# Patient Record
Sex: Female | Born: 1935 | Race: White | Hispanic: No | Smoking: Never smoker
Health system: Southern US, Community
[De-identification: ages and names within clinical notes are randomized; demographics above are authoritative.]

## PROBLEM LIST (undated history)

## (undated) DIAGNOSIS — G309 Alzheimer's disease, unspecified: Secondary | ICD-10-CM

## (undated) DIAGNOSIS — I10 Essential (primary) hypertension: Secondary | ICD-10-CM

## (undated) DIAGNOSIS — M810 Age-related osteoporosis without current pathological fracture: Secondary | ICD-10-CM

## (undated) DIAGNOSIS — F0280 Dementia in other diseases classified elsewhere without behavioral disturbance: Secondary | ICD-10-CM

## (undated) DIAGNOSIS — Z950 Presence of cardiac pacemaker: Secondary | ICD-10-CM

## (undated) HISTORY — PX: BREAST SURGERY: SHX581

---

## 2009-01-29 DIAGNOSIS — Z950 Presence of cardiac pacemaker: Secondary | ICD-10-CM

## 2009-01-29 HISTORY — DX: Presence of cardiac pacemaker: Z95.0

## 2009-01-29 HISTORY — PX: PACEMAKER INSERTION: SHX728

## 2013-05-11 DIAGNOSIS — G309 Alzheimer's disease, unspecified: Secondary | ICD-10-CM

## 2013-05-11 DIAGNOSIS — F0280 Dementia in other diseases classified elsewhere without behavioral disturbance: Secondary | ICD-10-CM | POA: Insufficient documentation

## 2013-05-11 DIAGNOSIS — I10 Essential (primary) hypertension: Secondary | ICD-10-CM | POA: Insufficient documentation

## 2013-05-11 DIAGNOSIS — M810 Age-related osteoporosis without current pathological fracture: Secondary | ICD-10-CM | POA: Insufficient documentation

## 2013-10-13 DIAGNOSIS — Z8673 Personal history of transient ischemic attack (TIA), and cerebral infarction without residual deficits: Secondary | ICD-10-CM | POA: Insufficient documentation

## 2013-10-13 DIAGNOSIS — I495 Sick sinus syndrome: Secondary | ICD-10-CM | POA: Insufficient documentation

## 2013-10-13 DIAGNOSIS — Z950 Presence of cardiac pacemaker: Secondary | ICD-10-CM | POA: Insufficient documentation

## 2013-10-14 ENCOUNTER — Emergency Department (HOSPITAL_COMMUNITY): Payer: Medicare Other

## 2013-10-14 ENCOUNTER — Inpatient Hospital Stay (HOSPITAL_COMMUNITY)
Admission: EM | Admit: 2013-10-14 | Discharge: 2013-10-16 | DRG: 310 | Disposition: A | Discharge status: Nursing Home (Includes ICF) | Payer: Medicare Other | Attending: Internal Medicine | Admitting: Internal Medicine

## 2013-10-14 ENCOUNTER — Encounter (HOSPITAL_COMMUNITY): Payer: Self-pay | Admitting: Emergency Medicine

## 2013-10-14 DIAGNOSIS — M810 Age-related osteoporosis without current pathological fracture: Secondary | ICD-10-CM | POA: Diagnosis present

## 2013-10-14 DIAGNOSIS — F0280 Dementia in other diseases classified elsewhere without behavioral disturbance: Secondary | ICD-10-CM | POA: Diagnosis present

## 2013-10-14 DIAGNOSIS — I10 Essential (primary) hypertension: Secondary | ICD-10-CM | POA: Diagnosis present

## 2013-10-14 DIAGNOSIS — R55 Syncope and collapse: Secondary | ICD-10-CM | POA: Diagnosis present

## 2013-10-14 DIAGNOSIS — I4892 Unspecified atrial flutter: Secondary | ICD-10-CM | POA: Diagnosis present

## 2013-10-14 DIAGNOSIS — I480 Paroxysmal atrial fibrillation: Secondary | ICD-10-CM

## 2013-10-14 DIAGNOSIS — Z79899 Other long term (current) drug therapy: Secondary | ICD-10-CM

## 2013-10-14 DIAGNOSIS — Z950 Presence of cardiac pacemaker: Secondary | ICD-10-CM | POA: Diagnosis present

## 2013-10-14 DIAGNOSIS — I479 Paroxysmal tachycardia, unspecified: Secondary | ICD-10-CM | POA: Diagnosis present

## 2013-10-14 DIAGNOSIS — Z7982 Long term (current) use of aspirin: Secondary | ICD-10-CM

## 2013-10-14 DIAGNOSIS — E860 Dehydration: Secondary | ICD-10-CM | POA: Diagnosis present

## 2013-10-14 DIAGNOSIS — G309 Alzheimer's disease, unspecified: Secondary | ICD-10-CM | POA: Diagnosis present

## 2013-10-14 DIAGNOSIS — F0390 Unspecified dementia without behavioral disturbance: Secondary | ICD-10-CM

## 2013-10-14 DIAGNOSIS — I4891 Unspecified atrial fibrillation: Secondary | ICD-10-CM | POA: Diagnosis not present

## 2013-10-14 DIAGNOSIS — Z66 Do not resuscitate: Secondary | ICD-10-CM | POA: Diagnosis present

## 2013-10-14 HISTORY — DX: Age-related osteoporosis without current pathological fracture: M81.0

## 2013-10-14 HISTORY — DX: Dementia in other diseases classified elsewhere, unspecified severity, without behavioral disturbance, psychotic disturbance, mood disturbance, and anxiety: F02.80

## 2013-10-14 HISTORY — DX: Alzheimer's disease, unspecified: G30.9

## 2013-10-14 HISTORY — DX: Essential (primary) hypertension: I10

## 2013-10-14 HISTORY — DX: Presence of cardiac pacemaker: Z95.0

## 2013-10-14 LAB — URINALYSIS, ROUTINE W REFLEX MICROSCOPIC
Bilirubin Urine: NEGATIVE
Glucose, UA: NEGATIVE mg/dL
Hgb urine dipstick: NEGATIVE
KETONES UR: NEGATIVE mg/dL
LEUKOCYTES UA: NEGATIVE
NITRITE: NEGATIVE
Protein, ur: NEGATIVE mg/dL
Specific Gravity, Urine: 1.008 (ref 1.005–1.030)
Urobilinogen, UA: 0.2 mg/dL (ref 0.0–1.0)
pH: 7 (ref 5.0–8.0)

## 2013-10-14 LAB — BASIC METABOLIC PANEL
ANION GAP: 8 (ref 5–15)
BUN: 19 mg/dL (ref 6–23)
CO2: 29 meq/L (ref 19–32)
Calcium: 10.6 mg/dL — ABNORMAL HIGH (ref 8.4–10.5)
Chloride: 101 mEq/L (ref 96–112)
Creatinine, Ser: 0.96 mg/dL (ref 0.50–1.10)
GFR calc Af Amer: 59 mL/min — ABNORMAL LOW (ref >90)
GFR calc non Af Amer: 51 mL/min — ABNORMAL LOW (ref >90)
Glucose, Bld: 69 mg/dL — ABNORMAL LOW (ref 70–99)
Potassium: 3.7 mEq/L (ref 3.7–5.3)
SODIUM: 138 meq/L (ref 137–147)

## 2013-10-14 LAB — CBC WITH DIFFERENTIAL/PLATELET
BASOS ABS: 0 10*3/uL (ref 0.0–0.1)
Basophils Relative: 1 % (ref 0–1)
Eosinophils Absolute: 0.1 10*3/uL (ref 0.0–0.7)
Eosinophils Relative: 2 % (ref 0–5)
HCT: 39.8 % (ref 36.0–46.0)
Hemoglobin: 12.9 g/dL (ref 12.0–15.0)
LYMPHS PCT: 22 % (ref 12–46)
Lymphs Abs: 1.1 10*3/uL (ref 0.7–4.0)
MCH: 31.2 pg (ref 26.0–34.0)
MCHC: 32.4 g/dL (ref 30.0–36.0)
MCV: 96.4 fL (ref 78.0–100.0)
Monocytes Absolute: 0.4 10*3/uL (ref 0.1–1.0)
Monocytes Relative: 8 % (ref 3–12)
NEUTROS ABS: 3.6 10*3/uL (ref 1.7–7.7)
NEUTROS PCT: 67 % (ref 43–77)
PLATELETS: 142 10*3/uL — AB (ref 150–400)
RBC: 4.13 MIL/uL (ref 3.87–5.11)
RDW: 13.4 % (ref 11.5–15.5)
WBC: 5.2 10*3/uL (ref 4.0–10.5)

## 2013-10-14 LAB — I-STAT TROPONIN, ED: Troponin i, poc: 0.01 ng/mL (ref 0.00–0.08)

## 2013-10-14 MED ORDER — DONEPEZIL HCL 5 MG PO TABS
5.0000 mg | ORAL_TABLET | Freq: Every day | ORAL | Status: DC
  Administered 2013-10-14 – 2013-10-15 (×2): 5 mg via ORAL
  Filled 2013-10-14 (×3): qty 1

## 2013-10-14 MED ORDER — ENOXAPARIN SODIUM 40 MG/0.4ML ~~LOC~~ SOLN
40.0000 mg | SUBCUTANEOUS | Status: DC
  Administered 2013-10-15: 40 mg via SUBCUTANEOUS
  Filled 2013-10-14 (×3): qty 0.4

## 2013-10-14 MED ORDER — RALOXIFENE HCL 60 MG PO TABS
60.0000 mg | ORAL_TABLET | Freq: Every day | ORAL | Status: DC
  Administered 2013-10-15 – 2013-10-16 (×2): 60 mg via ORAL
  Filled 2013-10-14 (×2): qty 1

## 2013-10-14 MED ORDER — POTASSIUM CHLORIDE CRYS ER 20 MEQ PO TBCR
20.0000 meq | EXTENDED_RELEASE_TABLET | Freq: Once | ORAL | Status: AC
  Administered 2013-10-14: 20 meq via ORAL
  Filled 2013-10-14: qty 1

## 2013-10-14 MED ORDER — ACETAMINOPHEN 325 MG PO TABS: 650.0000 mg | ORAL_TABLET | Freq: Four times a day (QID) | ORAL | Status: DC | PRN

## 2013-10-14 MED ORDER — ONDANSETRON HCL 4 MG PO TABS: 4.0000 mg | ORAL_TABLET | Freq: Four times a day (QID) | ORAL | Status: DC | PRN

## 2013-10-14 MED ORDER — SODIUM CHLORIDE 0.9 % IJ SOLN
3.0000 mL | Freq: Two times a day (BID) | INTRAMUSCULAR | Status: DC
  Administered 2013-10-14 – 2013-10-16 (×4): 3 mL via INTRAVENOUS

## 2013-10-14 MED ORDER — MEMANTINE HCL 10 MG PO TABS
10.0000 mg | ORAL_TABLET | Freq: Two times a day (BID) | ORAL | Status: DC
  Administered 2013-10-14 – 2013-10-16 (×4): 10 mg via ORAL
  Filled 2013-10-14 (×5): qty 1

## 2013-10-14 MED ORDER — ONDANSETRON HCL 4 MG/2ML IJ SOLN: 4.0000 mg | Freq: Four times a day (QID) | INTRAMUSCULAR | Status: DC | PRN

## 2013-10-14 MED ORDER — ACETAMINOPHEN 650 MG RE SUPP: 650.0000 mg | Freq: Four times a day (QID) | RECTAL | Status: DC | PRN

## 2013-10-14 MED ORDER — ASPIRIN 81 MG PO CHEW
81.0000 mg | CHEWABLE_TABLET | Freq: Every day | ORAL | Status: DC
  Administered 2013-10-15 – 2013-10-16 (×2): 81 mg via ORAL
  Filled 2013-10-14 (×2): qty 1

## 2013-10-14 MED ORDER — METOPROLOL TARTRATE 100 MG PO TABS
100.0000 mg | ORAL_TABLET | Freq: Two times a day (BID) | ORAL | Status: DC
  Administered 2013-10-14 – 2013-10-16 (×4): 100 mg via ORAL
  Filled 2013-10-14 (×5): qty 1

## 2013-10-14 MED ORDER — METOPROLOL TARTRATE 100 MG PO TABS
100.0000 mg | ORAL_TABLET | Freq: Two times a day (BID) | ORAL | Status: DC
  Filled 2013-10-14: qty 1

## 2013-10-14 MED ORDER — AMIODARONE HCL 200 MG PO TABS
200.0000 mg | ORAL_TABLET | Freq: Two times a day (BID) | ORAL | Status: DC
  Administered 2013-10-14 – 2013-10-16 (×4): 200 mg via ORAL
  Filled 2013-10-14 (×5): qty 1

## 2013-10-14 NOTE — ED Provider Notes (Signed)
CSN: [id number]     Arrival date & time 10/14/13  1052 History   First MD Initiated Contact with Patient 10/14/13 1100     Chief Complaint  Patient presents with  . Loss of Consciousness     (Consider location/radiation/quality/duration/timing/severity/associated sxs/prior Treatment) HPI  This is an 80 year old female with a history of Alzheimer's dementia, hypertension, and pacemaker who presents from [facility] assisted [facility] following a syncopal episode.  She was at breakfast this morning at approximately 8:45 AM, she was witnessed to have an unprovoked syncopal episode. She was unresponsive for a short period of time.  Her baseline upon arrival. Patient continually asked "why am I here." She denies any physical complaints including chest pain, shortness breath, abdominal pain, headache. Patient's daughters at the bedside. Daughter states that she is more confused than normal. Currently oriented only x2.  Followed by [facility] Dr. Mayme Genta and was just seen yesterday.  Level V caveat for dementia  Past Medical History  Diagnosis Date  . Pacemaker   . Alzheimer's dementia   . Hypertension   . Osteoporosis    Past Surgical History  Procedure Laterality Date  . Breast surgery     No family history on file. History  Substance Use Topics  . Smoking status: Never Smoker   . Smokeless tobacco: Not on file  . Alcohol Use: No   OB History   Grav Para Term Preterm Abortions TAB SAB Ect Mult Living                 Review of Systems  Constitutional: Negative for fever.  Respiratory: Negative for chest tightness and shortness of breath.   Cardiovascular: Negative for chest pain.  Gastrointestinal: Negative for nausea, vomiting and abdominal pain.  Genitourinary: Negative for dysuria.  Neurological: Positive for syncope. Negative for dizziness, weakness and headaches.  Psychiatric/Behavioral: Positive for confusion.      Allergies  Review of patient's  allergies indicates no known allergies.  Home Medications   Prior to Admission medications   Not on File   BP 118/50  Pulse 64  Temp(Src) 97.6 F (36.4 C) (Oral)  Resp 20  Ht  (1.753 m)  Wt 148 lb (67.132 kg)  BMI 21.85 kg/m2  SpO2 98% Physical Exam  Nursing note and vitals reviewed. Constitutional: She is oriented to person, place, and time. She appears well-developed and well-nourished.  Elderly, well groomed  HENT:  Head: Normocephalic and atraumatic.  Mouth/Throat: Oropharynx is clear and moist.  Eyes: Pupils are equal, round, and reactive to light.  Cardiovascular: Normal rate, regular rhythm and normal heart sounds.   No murmur heard. Pulmonary/Chest: Effort normal and breath sounds normal. No respiratory distress. She has no wheezes.  Pacer palpated   Abdominal: Soft. Bowel sounds are normal. There is no tenderness. There is no rebound.  Musculoskeletal: She exhibits no edema.  Neurological: She is alert and oriented to person, place, and time.  Cranial nerves II through XII intact, 5 out of 5 strength in all 4 extremities, fluent speech  Skin: Skin is warm and dry.  Psychiatric: She has a normal mood and affect.    ED Course  Procedures (including critical care time) Labs Review Labs Reviewed  CBC WITH DIFFERENTIAL - Abnormal; Notable for the following:    Platelets 142 (*)    All other components within normal limits  BASIC METABOLIC PANEL - Abnormal; Notable for the following:    Glucose, Bld 69 (*)    Calcium 10.6 (*)  GFR calc non Af Amer 51 (*)    GFR calc Af Amer 59 (*)    All other components within normal limits  URINALYSIS, ROUTINE W REFLEX MICROSCOPIC  I-STAT TROPOININ, ED    Imaging Review Ct Head Wo Contrast  10/14/2013   CLINICAL DATA:  Loss of consciousness.  EXAM: CT HEAD WITHOUT CONTRAST  TECHNIQUE: Contiguous axial images were obtained from the base of the skull through the vertex without intravenous contrast.  COMPARISON:  None.   FINDINGS: No acute intracranial hemorrhage. No focal mass lesion. No CT evidence of acute infarction. No midline shift or mass effect. No hydrocephalus. Basilar cisterns are patent.  Measure as cortical atrophy. There is mild periventricular white matter hypodensities.  IMPRESSION: 1. No acute intracranial findings. 2. Cortical atrophy and periventricular white matter microvascular disease   Electronically Signed   By: Genevive Bi M.D.   On: 10/14/2013 12:42   Dg Chest Portable 1 View  10/14/2013   CLINICAL DATA:  Hypertension and altered mental status  EXAM: PORTABLE CHEST - 1 VIEW  COMPARISON:  None.  FINDINGS: There is no edema or consolidation. Heart is slightly enlarged with pulmonary vascularity within normal limits. Pacemaker leads are attached to the right atrium and right ventricle. No pneumothorax. No adenopathy.  IMPRESSION: No edema or consolidation. Heart mildly enlarged. Pacemaker present.   Electronically Signed   By: Bretta Bang M.D.   On: 10/14/2013 11:25     EKG Interpretation   Date/Time:  Wednesday October 14 2013 11:01:35 EDT Ventricular Rate:  63 PR Interval:  260 QRS Duration: 93 QT Interval:  409 QTC Calculation: 419 R Axis:   38 Text Interpretation:  Sinus rhythm Atrial premature complex Prolonged PR  interval Low voltage, precordial leads Abnormal R-wave progression, early  transition ST elevation suggests acute pericarditis No prior for  comparison Confirmed by Ellsworth Waldschmidt  MD, Toni Amend ([id number]) on 10/14/2013 12:05:25  PM      MDM   Final diagnoses:  Syncope, cardiogenic    Patient presents with an episode of syncope. History of dementia and is oriented x2. Has no physical complaints at this time. She is nonfocal on exam.  EKG shows abnormal R-wave progression early ST elevation which is diffuse. No prior for comparison.  Troponin and chest x-ray are reassuring. Basic labwork is also reassuring including urine. Pacemaker interrogation by [employer] revealed several episodes between 8:30 and 9:15 this morning of what appears to be atrial tachycardia with rates greater than 200.  This timeframe corresponds with time noted by living facility the patient had the syncopal episode. Patient's primary cardiologist is [facility] cardiology. Discussed with nurse at the cardiology office. The patient's cardiologist and PA are not in the office today. We'll admit to the hospitalist with cardiology evaluation here.    Shon Baton, MD 10/15/13 [phone]

## 2013-10-14 NOTE — ED Notes (Signed)
Admitting MD at bedside.

## 2013-10-14 NOTE — ED Notes (Signed)
[employer] at bedside.  

## 2013-10-14 NOTE — Consult Note (Signed)
Reason for Consult:   Syncope  Requesting Physician: [facility]  HPI: This is a 80 y.o.,pleasant female with a past medical history significant for dementia. 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. She was recently placed in assisted living in [facility], she was previously living alone in [place]. Her daughters felt that she was unable to care for herself. The pt's history is obtained from her daughter who was present through the interview and exam. The pt herself is awake and alert and [pleasant but unable to give any details of today's events or her PMH.  The pt apparently was at breakfast at her assisted living facility when she suddenly collapsed into her plate. There was no seizure activity noted. She was brought to the ER by EMS. Pacer interrogation revealed "V-tachy" rhythm 165-200. When I interviewed her she was completely asymptomatic. Her daughter tells me she was just recently seen by [facility] to get established and for some recent low extremity edema. I have requested those records.   PMHx:  Past Medical History  Diagnosis Date  . Pacemaker   . Alzheimer's dementia   . Hypertension   . Osteoporosis     Past Surgical History  Procedure Laterality Date  . Breast surgery      SOCHx:  reports that she has never smoked. She does not have any smokeless tobacco history on file. She reports that she does not drink alcohol or use illicit drugs.  FAMHx: No family history of coronary artery disease   ALLERGIES: No Known Allergies  ROS: Review of systems not obtained due to patient factors.  HOME MEDICATIONS: Prior to Admission medications   Medication Sig Start Date End Date Taking? Authorizing Provider  aspirin 81 MG chewable tablet Chew 81 mg by mouth daily.   Yes Historical Provider, MD  donepezil (ARICEPT) 5 MG tablet Take 5 mg by mouth at bedtime.   Yes Historical Provider,  MD  furosemide (LASIX) 40 MG tablet Take 40 mg by mouth daily.   Yes Historical Provider, MD  memantine (NAMENDA) 10 MG tablet Take 10 mg by mouth 2 (two) times daily.   Yes Historical Provider, MD  metoprolol (LOPRESSOR) 100 MG tablet Take 100 mg by mouth 2 (two) times daily.   Yes Historical Provider, MD  raloxifene (EVISTA) 60 MG tablet Take 60 mg by mouth daily.   Yes Historical Provider, MD    HOSPITAL MEDICATIONS: I have reviewed the patient's current medications.  VITALS: Blood pressure 115/50, pulse 122, temperature 97.6 F (36.4 C), temperature source Oral, resp. rate 22, height  (1.753 m), weight 146 lb 11.2 oz (66.543 kg), SpO2 92.00%.  PHYSICAL EXAM: General appearance: alert, cooperative, no distress and thin, pleasantly demented Neck: no carotid bruit and no JVD Lungs: clear to auscultation bilaterally Heart: regular rate and rhythm and soft systolic murmur LSB Abdomen: soft, non-tender; bowel sounds normal; no masses,  no organomegaly Extremities: trace to 1+ LE edema Pulses: diminnished Skin: pale, cool, dry Neurologic: Grossly normal except for memory deficit   LABS: Results for orders placed during the hospital encounter of 10/14/13 (from the past 24 hour(s))  CBC WITH DIFFERENTIAL     Status: Abnormal   Collection Time    10/14/13 11:11 AM      Result Value Ref Range   WBC 5.2  4.0 - 10.5 K/uL   RBC 4.13  3.87 - 5.11 MIL/uL   Hemoglobin 12.9  12.0 - 15.0 g/dL   HCT 16.1  09.6 - 04.5 %   MCV 96.4  78.0 - 100.0 fL   MCH 31.2  26.0 - 34.0 pg   MCHC 32.4  30.0 - 36.0 g/dL   RDW 40.9  81.1 - 91.4 %   Platelets 142 (*) 150 - 400 K/uL   Neutrophils Relative % 67  43 - 77 %   Neutro Abs 3.6  1.7 - 7.7 K/uL   Lymphocytes Relative 22  12 - 46 %   Lymphs Abs 1.1  0.7 - 4.0 K/uL   Monocytes Relative 8  3 - 12 %   Monocytes Absolute 0.4  0.1 - 1.0 K/uL   Eosinophils Relative 2  0 - 5 %   Eosinophils Absolute 0.1  0.0 - 0.7 K/uL   Basophils Relative 1  0 - 1  %   Basophils Absolute 0.0  0.0 - 0.1 K/uL  BASIC METABOLIC PANEL     Status: Abnormal   Collection Time    10/14/13 11:11 AM      Result Value Ref Range   Sodium 138  137 - 147 mEq/L   Potassium 3.7  3.7 - 5.3 mEq/L   Chloride 101  96 - 112 mEq/L   CO2 29  19 - 32 mEq/L   Glucose, Bld 69 (*) 70 - 99 mg/dL   BUN 19  6 - 23 mg/dL   Creatinine, Ser 7.82  0.50 - 1.10 mg/dL   Calcium 95.6 (*) 8.4 - 10.5 mg/dL   GFR calc non Af Amer 51 (*) >90 mL/min   GFR calc Af Amer 59 (*) >90 mL/min   Anion gap 8  5 - 15  URINALYSIS, ROUTINE W REFLEX MICROSCOPIC     Status: None   Collection Time    10/14/13 11:30 AM      Result Value Ref Range   Color, Urine YELLOW  YELLOW   APPearance CLEAR  CLEAR   Specific Gravity, Urine 1.008  1.005 - 1.030   pH 7.0  5.0 - 8.0   Glucose, UA NEGATIVE  NEGATIVE mg/dL   Hgb urine dipstick NEGATIVE  NEGATIVE   Bilirubin Urine NEGATIVE  NEGATIVE   Ketones, ur NEGATIVE  NEGATIVE mg/dL   Protein, ur NEGATIVE  NEGATIVE mg/dL   Urobilinogen, UA 0.2  0.0 - 1.0 mg/dL   Nitrite NEGATIVE  NEGATIVE   Leukocytes, UA NEGATIVE  NEGATIVE  I-STAT TROPOININ, ED     Status: None   Collection Time    10/14/13  1:45 PM      Result Value Ref Range   Troponin i, poc 0.01  0.00 - 0.08 ng/mL   Comment 3             EKG: NSR 1st AVB, diffuse ST elevation, QTc 419  IMAGING: Ct Head Wo Contrast  10/14/2013   CLINICAL DATA:  Loss of consciousness.  EXAM: CT HEAD WITHOUT CONTRAST  TECHNIQUE: Contiguous axial images were obtained from the base of the skull through the vertex without intravenous contrast.  COMPARISON:  None.  FINDINGS: No acute intracranial hemorrhage. No focal mass lesion. No CT evidence of acute infarction. No midline shift or mass effect. No hydrocephalus. Basilar cisterns are patent.  Measure as cortical atrophy. There is mild periventricular white matter hypodensities.  IMPRESSION: 1. No acute intracranial findings. 2. Cortical atrophy and periventricular white  matter microvascular disease   Electronically Signed  By: Genevive Bi M.D.   On: 10/14/2013 12:42   Dg Chest Portable 1 View  10/14/2013   CLINICAL DATA:  Hypertension and altered mental status  EXAM: PORTABLE CHEST - 1 VIEW  COMPARISON:  None.  FINDINGS: There is no edema or consolidation. Heart is slightly enlarged with pulmonary vascularity within normal limits. Pacemaker leads are attached to the right atrium and right ventricle. No pneumothorax. No adenopathy.  IMPRESSION: No edema or consolidation. Heart mildly enlarged. Pacemaker present.   Electronically Signed   By: Bretta Bang M.D.   On: 10/14/2013 11:25    IMPRESSION: Principal Problem:   Syncope and collapse Active Problems:   Tachycardia, paroxysmal- HR as high as 200 (Vent tachy rythm) per pacemaker check   Cardiac pacemaker in situ-implanted 2011   HTN (hypertension)   Dementia- recently placed in assited living   RECOMMENDATION: 20 meqKCL x 1. Continue beta blocker (metoprolol 100 mg BID), review records from [facility]. Dr Ladona Ridgel to see and review pacemaker strips. Check TSH, Mg++ in am.   Time Spent Directly with Patient: 45 minutes  Abelino Derrick [phone] beeper 10/14/2013, 3:55 PM   EP Attending  Patient seen and examined.Her PPM data has been reivewedAgree with above except that the patient's arrhythmia is actually a combination of atrial tachy with 1:1 AV conduction as well as atrial flutter with an RVR. She does not have VT. This is despite fairly high dose beta blockade. I would recommend initiation of amiodarone. She will need another day or two in the hospital.    Leonia Reeves.D.

## 2013-10-14 NOTE — ED Notes (Signed)
Per EMS, patient was at dinner table this morning, at [facility] and the staff found her "passed out and unresponsive".   They called 911 and patient was awake, alert and at baseline when EMS arrived.   Patient does have history of alzheimers, pacemaker, etc.   Patients CBG 102 with EMS.   22 L hand placed.

## 2013-10-14 NOTE — H&P (Signed)
[facility] History and Physical  Leonna Lacross ZDG:[id number] DOB: 1922/04/04 DOA: 10/14/2013  Referring physician:  PCP: Herb Grays, MD  Specialists:   Chief Complaint: syncope   HPI: Kelli Pittman is a 80 y.o. female with PMH of HTN, s/p PPM, dementia presented from [place] assisted living following a syncopal episode happened during the breakfast time this morning; She was witnessed to have an unprovoked syncopal episode. She was unresponsive for a short period of time; Patient has dementia, poor historian; but no focal neuro weaknesses, no urinary or fecal incontinence; no fever, no SOB, no chest pain, no nausea, vomiting or diarrhea -Pacemaker interrogation: prelim atrial tachyarrythmia during syncope; cardiology consulted by ED   Review of Systems: The patient denies anorexia, fever, weight loss,, vision loss, decreased hearing, hoarseness, chest pain, dyspnea on exertion, peripheral edema, balance deficits, hemoptysis, abdominal pain, melena, hematochezia, severe indigestion/heartburn, hematuria, incontinence, genital sores, muscle weakness, suspicious skin lesions, transient blindness, difficulty walking, depression, unusual weight change, abnormal bleeding, enlarged lymph nodes, angioedema, and breast masses.    Past Medical History  Diagnosis Date  . Pacemaker   . Alzheimer's dementia   . Hypertension   . Osteoporosis    Past Surgical History  Procedure Laterality Date  . Breast surgery     Social History:  reports that she has never smoked. She does not have any smokeless tobacco history on file. She reports that she does not drink alcohol or use illicit drugs. AS;  where does patient live--home, ALF, SNF? and with whom if at home? Yes;  Can patient participate in ADLs?  No Known Allergies  No family history on file.  (be sure to complete)  Prior to Admission medications   Medication Sig Start Date End Date Taking? Authorizing Provider  aspirin 81 MG chewable  tablet Chew 81 mg by mouth daily.   Yes Historical Provider, MD  donepezil (ARICEPT) 5 MG tablet Take 5 mg by mouth at bedtime.   Yes Historical Provider, MD  furosemide (LASIX) 40 MG tablet Take 40 mg by mouth daily.   Yes Historical Provider, MD  memantine (NAMENDA) 10 MG tablet Take 10 mg by mouth 2 (two) times daily.   Yes Historical Provider, MD  metoprolol (LOPRESSOR) 100 MG tablet Take 100 mg by mouth 2 (two) times daily.   Yes Historical Provider, MD  raloxifene (EVISTA) 60 MG tablet Take 60 mg by mouth daily.   Yes Historical Provider, MD   Physical Exam: Filed Vitals:   10/14/13 1513  BP:   Pulse: 122  Temp:   Resp:      General:  Alert, confused  Eyes: EOM-I  ENT: no oral ulcers   Neck: supple, no JVD  Cardiovascular: s1,s2 rrr  Respiratory: CTA BL  Abdomen: soft, nt,nd   Skin: no rash   Musculoskeletal: no LE edema  Psychiatric: no hallucinations   Neurologic: confused at baseline; CN 2-12 intact; motor 5/5 BL  Labs on Admission:  Basic Metabolic Panel:  Recent Labs Lab 10/14/13 1111  NA 138  K 3.7  CL 101  CO2 29  GLUCOSE 69*  BUN 19  CREATININE 0.96  CALCIUM 10.6*   Liver Function Tests: No results found for this basename: AST, ALT, ALKPHOS, BILITOT, PROT, ALBUMIN,  in the last 168 hours No results found for this basename: LIPASE, AMYLASE,  in the last 168 hours No results found for this basename: AMMONIA,  in the last 168 hours CBC:  Recent Labs Lab 10/14/13 1111  WBC 5.2  NEUTROABS 3.6  HGB 12.9  HCT 39.8  MCV 96.4  PLT 142*   Cardiac Enzymes: No results found for this basename: CKTOTAL, CKMB, CKMBINDEX, TROPONINI,  in the last 168 hours  BNP (last 3 results) No results found for this basename: PROBNP,  in the last 8760 hours CBG: No results found for this basename: GLUCAP,  in the last 168 hours  Radiological Exams on Admission: Ct Head Wo Contrast  10/14/2013   CLINICAL DATA:  Loss of consciousness.  EXAM: CT HEAD  WITHOUT CONTRAST  TECHNIQUE: Contiguous axial images were obtained from the base of the skull through the vertex without intravenous contrast.  COMPARISON:  None.  FINDINGS: No acute intracranial hemorrhage. No focal mass lesion. No CT evidence of acute infarction. No midline shift or mass effect. No hydrocephalus. Basilar cisterns are patent.  Measure as cortical atrophy. There is mild periventricular white matter hypodensities.  IMPRESSION: 1. No acute intracranial findings. 2. Cortical atrophy and periventricular white matter microvascular disease   Electronically Signed   By: Genevive Bi M.D.   On: 10/14/2013 12:42   Dg Chest Portable 1 View  10/14/2013   CLINICAL DATA:  Hypertension and altered mental status  EXAM: PORTABLE CHEST - 1 VIEW  COMPARISON:  None.  FINDINGS: There is no edema or consolidation. Heart is slightly enlarged with pulmonary vascularity within normal limits. Pacemaker leads are attached to the right atrium and right ventricle. No pneumothorax. No adenopathy.  IMPRESSION: No edema or consolidation. Heart mildly enlarged. Pacemaker present.   Electronically Signed   By: Bretta Bang M.D.   On: 10/14/2013 11:25    EKG: Independently reviewed.   Assessment/Plan Principal Problem:   Syncope   80 y.o. female with PMH of HTN, s/p PPM, dementia presented from [place] assisted living following a syncopal episode   1. Syncope suspected cardiac arrythmia; PPM interrogation: tachyarrhythmia; neuro exam no focal;  -monitor on tele; cont BB; cardiology eval; hold diuretic, obtain echo  -no s/s of infection, CXR, UA unremarkable; monitor   2. Dementia confused; cont home regimen -CT: No acute intracranial findings. Cortical atrophy and periventricular white matter microvascular disease 3. HTN, cont BB; hold diuretics; Pt is clinically euvolemic;  4. S/p PPM (2011); f/u cardiology eval   D/w patient, her daughter   Cardiology;  if consultant consulted, please document  name and whether formally or informally consulted  Code Status: DNR (must indicate code status--if unknown or must be presumed, indicate so) Family Communication: d/w patient, her daughter  (indicate person spoken with, if applicable, with phone number if by telephone) Disposition Plan: pend cardiology eval  (indicate anticipated LOS)  Time spent: >35 minutes   Esperanza Sheets [facility] Pager [phone]  If 7PM-7AM, please contact night-coverage [email or web address] Password [facility] 10/14/2013, 3:45 PM

## 2013-10-14 NOTE — ED Notes (Signed)
I gave the patient a happy meal and a cup of decaf coffee.

## 2013-10-15 DIAGNOSIS — I359 Nonrheumatic aortic valve disorder, unspecified: Secondary | ICD-10-CM

## 2013-10-15 DIAGNOSIS — F0280 Dementia in other diseases classified elsewhere without behavioral disturbance: Secondary | ICD-10-CM | POA: Diagnosis present

## 2013-10-15 DIAGNOSIS — R55 Syncope and collapse: Secondary | ICD-10-CM | POA: Diagnosis present

## 2013-10-15 DIAGNOSIS — Z950 Presence of cardiac pacemaker: Secondary | ICD-10-CM | POA: Diagnosis not present

## 2013-10-15 DIAGNOSIS — Z79899 Other long term (current) drug therapy: Secondary | ICD-10-CM | POA: Diagnosis not present

## 2013-10-15 DIAGNOSIS — I10 Essential (primary) hypertension: Secondary | ICD-10-CM | POA: Diagnosis present

## 2013-10-15 DIAGNOSIS — I4892 Unspecified atrial flutter: Secondary | ICD-10-CM | POA: Diagnosis present

## 2013-10-15 DIAGNOSIS — I4891 Unspecified atrial fibrillation: Secondary | ICD-10-CM | POA: Diagnosis present

## 2013-10-15 DIAGNOSIS — M810 Age-related osteoporosis without current pathological fracture: Secondary | ICD-10-CM | POA: Diagnosis present

## 2013-10-15 DIAGNOSIS — Z7982 Long term (current) use of aspirin: Secondary | ICD-10-CM | POA: Diagnosis not present

## 2013-10-15 DIAGNOSIS — E860 Dehydration: Secondary | ICD-10-CM | POA: Diagnosis present

## 2013-10-15 DIAGNOSIS — Z66 Do not resuscitate: Secondary | ICD-10-CM | POA: Diagnosis present

## 2013-10-15 LAB — MAGNESIUM: Magnesium: 2 mg/dL (ref 1.5–2.5)

## 2013-10-15 LAB — TSH: TSH: 3.35 u[IU]/mL (ref 0.350–4.500)

## 2013-10-15 LAB — BASIC METABOLIC PANEL
Anion gap: 8 (ref 5–15)
BUN: 21 mg/dL (ref 6–23)
CO2: 30 mEq/L (ref 19–32)
Calcium: 10.3 mg/dL (ref 8.4–10.5)
Chloride: 106 mEq/L (ref 96–112)
Creatinine, Ser: 0.98 mg/dL (ref 0.50–1.10)
GFR calc Af Amer: 58 mL/min — ABNORMAL LOW (ref >90)
GFR calc non Af Amer: 50 mL/min — ABNORMAL LOW (ref >90)
Glucose, Bld: 95 mg/dL (ref 70–99)
Potassium: 4.5 mEq/L (ref 3.7–5.3)
Sodium: 144 mEq/L (ref 137–147)

## 2013-10-15 NOTE — Progress Notes (Signed)
SUBJECTIVE: The patient is alert to herself only this morning.  No family at bedside.  At this time, she denies chest pain, shortness of breath, or any new concerns.  She does not know why she is here or what happened prior to admission.    PPM interrogation yesterday demonstrated normal device function with runs of atrial tach vs atrial flutter with intermittent 1:1 conduction felt to be the likely cause of her syncope.   Amiodarone was added.   CURRENT MEDICATIONS: . amiodarone  200 mg Oral BID  . aspirin  81 mg Oral Daily  . donepezil  5 mg Oral QHS  . enoxaparin (LOVENOX) injection  40 mg Subcutaneous Q24H  . memantine  10 mg Oral BID  . metoprolol  100 mg Oral BID  . raloxifene  60 mg Oral Daily  . sodium chloride  3 mL Intravenous Q12H      OBJECTIVE: Physical Exam: Filed Vitals:   10/14/13 1542 10/14/13 1600 10/14/13 2043 10/15/13 0445  BP:  144/52 125/49 132/59  Pulse:  73 72 68  Temp:   98.1 F (36.7 C) 98.2 F (36.8 C)  TempSrc:   Oral Oral  Resp:  Height:      Weight: 146 lb 11.2 oz (66.543 kg)   153 lb 6.4 oz (69.582 kg)  SpO2:  100% 96% 95%    Intake/Output Summary (Last 24 hours) at 10/15/13 0817 Last data filed at 10/14/13 2142  Gross per 24 hour  Intake      3 ml  Output      0 ml  Net      3 ml    Telemetry reveals sinus rhythm with intermittent atrial pacing   GEN- The patient is elderly appearing, alert and oriented x 3 today.   Head- normocephalic, atraumatic Eyes-  Sclera clear, conjunctiva pink Ears- hearing intact Oropharynx- clear Neck- supple, no JVP Lungs- Clear to ausculation bilaterally, normal work of breathing Heart- Regular rate and rhythm  GI- soft, NT, ND, + BS Extremities- no clubbing, cyanosis, or edema  LABS: Basic Metabolic Panel:  Recent Labs  65/78/46 1111 10/15/13 0545  NA 138 144  K 3.7 4.5  CL 101 106  CO2 29 30  GLUCOSE 69* 95  BUN 19 21  CREATININE 0.96 0.98  CALCIUM 10.6* 10.3  MG  --   2.0   CBC:  Recent Labs  10/14/13 1111  WBC 5.2  NEUTROABS 3.6  HGB 12.9  HCT 39.8  MCV 96.4  PLT 142*   Thyroid Function Tests:  Recent Labs  10/15/13 0545  TSH 3.350    RADIOLOGY: Ct Head Wo Contrast 10/14/2013   CLINICAL DATA:  Loss of consciousness.  EXAM: CT HEAD WITHOUT CONTRAST  TECHNIQUE: Contiguous axial images were obtained from the base of the skull through the vertex without intravenous contrast.  COMPARISON:  None.  FINDINGS: No acute intracranial hemorrhage. No focal mass lesion. No CT evidence of acute infarction. No midline shift or mass effect. No hydrocephalus. Basilar cisterns are patent.  Measure as cortical atrophy. There is mild periventricular white matter hypodensities.  IMPRESSION: 1. No acute intracranial findings. 2. Cortical atrophy and periventricular white matter microvascular disease   Electronically Signed   By: Genevive Bi M.D.   On: 10/14/2013 12:42   Dg Chest Portable 1 View 10/14/2013   CLINICAL DATA:  Hypertension and altered mental status  EXAM: PORTABLE CHEST - 1 VIEW  COMPARISON:  None.  FINDINGS:  There is no edema or consolidation. Heart is slightly enlarged with pulmonary vascularity within normal limits. Pacemaker leads are attached to the right atrium and right ventricle. No pneumothorax. No adenopathy.  IMPRESSION: No edema or consolidation. Heart mildly enlarged. Pacemaker present.   Electronically Signed   By: Bretta Bang M.D.   On: 10/14/2013 11:25    ASSESSMENT AND PLAN:  Principal Problem:   Syncope and collapse Active Problems:   Tachycardia, paroxysmal- HR as high as 200 (Vent tachy rythm) per pacemaker check   Cardiac pacemaker in situ-implanted 2011   HTN (hypertension)   Dementia- recently placed in assited living   Syncope  1. Atrial fibrillation with RVR Doing well with amiodarone Would continue amiodarone  BId x 7 days then amiodarone  daily thereafter chads2vasc score is at least4,  She should  follow-up with her primary cardiologist in [facility] in 1 week to discuss anticoagulation Check LFTs in am  Electrophysiology team to see as needed while here. Please call with questions.

## 2013-10-15 NOTE — Progress Notes (Signed)
Kelli Pittman PROGRESS NOTE  Kelli Pittman AOZ:[id number] DOB: 1974/06/22 DOA: 10/14/2013 PCP: Kelli Grays, MD  Assessment/Plan:   Syncope and Collapse -Likely secondary to arrhythmia- denies any episodes of syncope, dizziness or palpitaitons -CT head and CXR- No abnormalities -TSH, Mg, UA normal  Tachycardia -Stable HR -Appreciate Cardiology Following- arrhythmia is combination of atrial tachycardia and atrial flutter with RVR and does not have VT. Initiated Amiodarone 200 mg BID  -Continue BB metoprolol 100 mg BID -2D echo pending  Dementia -Recently placed in [facility] assisted living facility. -continue regimen of memantine and donepezil  Hypertension -Stable- continue metoprolol 100 mg BID  Hypercalcemia -Resolved.  -Follow BMET  Osteoprosis Continue regimen of raloxifene   DVT Prophylaxis Lovenox Code Status: DNR Family Communication: No family at bedside Disposition Plan: Inpatient, [facility] when stable   Consultants:  Cardiology  Procedures:    None  Antibiotics:  None  HPI This is a 80 y.o.Caucasian female with a PMH of dementia who presented to ED via EMS with  She was accompanied by her daughter yesterday who helped with history.  The patient is awake and alert but is unable to give details of what happened to her. The patient apparently was eating breakfast at her assisted living facility when she suddenly collapsed into her plate and subsequently regained consciousness.  There was no seizure activity noted. She was brought to the ER by EMS and pacer interrogation revealed "V-tachy" rhythm 165-200. When initially interviewed patient was asymptomatic. The daughter states patient has recently seen [facility] for lower extremity edema, and those records have been requested by cardiologist.  Subjective: Without complaints. Denies dizziness, SOB, chest pain   Objective: Filed Vitals:   10/15/13 1355  BP: 135/63   Pulse: 66  Temp: 98.2 F (36.8 C)  Resp: 16    Intake/Output Summary (Last 24 hours) at 10/15/13 1435 Last data filed at 10/15/13 1059  Gross per 24 hour  Intake    246 ml  Output      0 ml  Net    246 ml   Filed Weights   10/14/13 1056 10/14/13 1542 10/15/13 0445  Weight: 67.132 kg (148 lb) 66.543 kg (146 lb 11.2 oz) 69.582 kg (153 lb 6.4 oz)    Exam:  Gen: Alert only to self. Pleasantly demented caucasian female, in NAD  HEENT: Normocephalic, atraumatic.  Pupils symmertrical.  Moist mucosa.   Chest: clear to auscultate bilaterally, no ronchi or rales  Cardiac: Regular rate and rhythm, S1-S2, no rubs murmurs or gallops  Abdomen: soft, non tender, non distended, +bowel sounds. No guarding or rigidity  Extremities: Symmetrical in appearance without cyanosis. Trace edema  Neurological: Alert awake, oriented only to person.  Psychiatric: Appears normal.   Data Reviewed: Basic Metabolic Panel:  Recent Labs Lab 10/14/13 1111 10/15/13 0545  NA 138 144  K 3.7 4.5  CL 101 106  CO2 29 30  GLUCOSE 69* 95  BUN 19 21  CREATININE 0.96 0.98  CALCIUM 10.6* 10.3  MG  --  2.0   Liver Function Tests: No results found for this basename: AST, ALT, ALKPHOS, BILITOT, PROT, ALBUMIN,  in the last 168 hours No results found for this basename: LIPASE, AMYLASE,  in the last 168 hours No results found for this basename: AMMONIA,  in the last 168 hours CBC:  Recent Labs Lab 10/14/13 1111  WBC 5.2  NEUTROABS 3.6  HGB 12.9  HCT 39.8  MCV 96.4  PLT 142*  Cardiac Enzymes: No results found for this basename: CKTOTAL, CKMB, CKMBINDEX, TROPONINI,  in the last 168 hours BNP (last 3 results) No results found for this basename: PROBNP,  in the last 8760 hours CBG: No results found for this basename: GLUCAP,  in the last 168 hours  No results found for this or any previous visit (from the past 240 hour(s)).   Studies: Ct Head Wo Contrast  10/14/2013   CLINICAL DATA:  Loss of  consciousness.  EXAM: CT HEAD WITHOUT CONTRAST  TECHNIQUE: Contiguous axial images were obtained from the base of the skull through the vertex without intravenous contrast.  COMPARISON:  None.  FINDINGS: No acute intracranial hemorrhage. No focal mass lesion. No CT evidence of acute infarction. No midline shift or mass effect. No hydrocephalus. Basilar cisterns are patent.  Measure as cortical atrophy. There is mild periventricular white matter hypodensities.  IMPRESSION: 1. No acute intracranial findings. 2. Cortical atrophy and periventricular white matter microvascular disease   Electronically Signed   By: Kelli Pittman M.D.   On: 10/14/2013 12:42   Dg Chest Portable 1 View  10/14/2013   CLINICAL DATA:  Hypertension and altered mental status  EXAM: PORTABLE CHEST - 1 VIEW  COMPARISON:  None.  FINDINGS: There is no edema or consolidation. Heart is slightly enlarged with pulmonary vascularity within normal limits. Pacemaker leads are attached to the right atrium and right ventricle. No pneumothorax. No adenopathy.  IMPRESSION: No edema or consolidation. Heart mildly enlarged. Pacemaker present.   Electronically Signed   By: Kelli Pittman M.D.   On: 10/14/2013 11:25    Scheduled Meds: . amiodarone  200 mg Oral BID  . aspirin  81 mg Oral Daily  . donepezil  5 mg Oral QHS  . enoxaparin (LOVENOX) injection  40 mg Subcutaneous Q24H  . memantine  10 mg Oral BID  . metoprolol  100 mg Oral BID  . raloxifene  60 mg Oral Daily  . sodium chloride  3 mL Intravenous Q12H   Continuous Infusions:   Principal Problem:   Syncope and collapse Active Problems:   Tachycardia, paroxysmal- HR as high as 200 (Vent tachy rythm) per pacemaker check   Cardiac pacemaker in situ-implanted 2011   HTN (hypertension)   Dementia- recently placed in assited living   Syncope    Time spent: 35    Kelli Pittman Kelli Pittman  Kelli Pittman Pager [phone]. If 7PM-7AM, please contact night-coverage at [email or web address],  password [facility] 10/15/2013, 2:35 PM  LOS: 1 day

## 2013-10-15 NOTE — Progress Notes (Signed)
Utilization Review Completed.Sahar Ryback T9/17/2015  

## 2013-10-15 NOTE — Progress Notes (Signed)
-   I agree with plan and data as above. - Syncope most liekly due to Atrial flutter with 2:1 block. She responded well to amio. - Cont amio PO BID for 7 days then follow up with cards as an outpatient. - IF stable home in 24 hrs.

## 2013-10-15 NOTE — Progress Notes (Addendum)
Clinical Social Work Department BRIEF PSYCHOSOCIAL ASSESSMENT 10/15/2013  Patient:  Kelli Pittman, Kelli Pittman     Account Number:  [id number]     Admit date:  10/14/2013  Clinical Social Worker:  Harless Nakayama  Date/Time:  10/15/2013 10:45 AM  Referred by:  Physician  Date Referred:  10/15/2013 Referred for  ALF Placement   Other Referral:   Interview type:  Family Other interview type:   Spoke with pt daughter    PSYCHOSOCIAL DATA Living Status:  FACILITY Admitted from facility:  [facility] [address] Level of care:  Assisted Living Primary support name:  Newt Lukes Primary support relationship to patient:  CHILD, ADULT Degree of support available:   Pt has supportive family    CURRENT CONCERNS Current Concerns  Post-Acute Placement   Other Concerns:    SOCIAL WORK ASSESSMENT / PLAN CSW made aware that pt was admitted from facility. CSW spoke with pt daughter who confirmed pt was admitted from [facility] and has been a resident there for about 6 weeks. Pt daughter informed CSW she is happy with facility and has no concerns about care pt receives. Pt daughter did state they are anxious about pt dementia progressing quickly. At this time, pt is not on a memory care unit but pt daughter informed CSW this is something they know they will be transitioning to in the future. CSW discussed discharge with pt daughter. She had concerns about being able to get medication changes to facility and CSW explained that this was CSW role and CSW would assist with communicating with facility. Pt daughter thankful for this and expressed relief that CSW would be handleing this. Pt daughter informed CSW that she plans on transporting pt back to facility.    CSW called [facility] and spoke with Lauren who provided CSW with fax number [phone] and informed CSW that contact person to confirm dc would be April Moore or [facility]t Minion. CSW to send updated FL2 when dc medication list is  available.   Assessment/plan status:  Psychosocial Support/Ongoing Assessment of Needs Other assessment/ plan:   Information/referral to community resources:   None needed at this time    PATIENT'S/FAMILY'S RESPONSE TO PLAN OF CARE: Pt daughter pleasant and coopeartive. She is agreeable to pt returning to ALF.       Sammie Schermerhorn, LCSWA [phone]

## 2013-10-15 NOTE — Progress Notes (Signed)
Echocardiogram 2D Echocardiogram has been performed.  Rayen Dafoe 10/15/2013, 11:53 AM

## 2013-10-16 DIAGNOSIS — I10 Essential (primary) hypertension: Secondary | ICD-10-CM

## 2013-10-16 LAB — HEPATIC FUNCTION PANEL
ALT: 6 U/L (ref 0–35)
AST: 13 U/L (ref 0–37)
Albumin: 3.3 g/dL — ABNORMAL LOW (ref 3.5–5.2)
Alkaline Phosphatase: 69 U/L (ref 39–117)
BILIRUBIN TOTAL: 0.5 mg/dL (ref 0.3–1.2)
Bilirubin, Direct: <0.2 mg/dL (ref 0.0–0.3)
Total Protein: 5.5 g/dL — ABNORMAL LOW (ref 6.0–8.3)

## 2013-10-16 MED ORDER — AMIODARONE HCL 200 MG PO TABS: 200.0000 mg | ORAL_TABLET | Freq: Two times a day (BID) | ORAL | Status: DC

## 2013-10-16 MED ORDER — AMIODARONE HCL 200 MG PO TABS: 200.0000 mg | ORAL_TABLET | Freq: Every day | ORAL | Status: AC

## 2013-10-16 NOTE — Plan of Care (Signed)
Problem: Phase III Progression Outcomes Goal: Discharge plan remains appropriate-arrangements made Outcome: Progressing No acute events this shift.  Patient remains disoriented to place and situation at baseline. VSS.  No ectopy observed.  Will continue to monitor patient condition.

## 2013-10-16 NOTE — Progress Notes (Signed)
CSW (Clinical Child psychotherapist) aware that pt daughter chose to transport pt. Pt was discharged without dc packet. CSW spoke with Newt Minion at [facility] and notified. CSW faxed signed FL2 to facility. Lupita Leash confirmed receipt of fax and that everything look okay. CSW signing off.  Cybill Uriegas, LCSWA [phone]

## 2013-10-16 NOTE — Discharge Summary (Signed)
Physician Discharge Summary  Patient ID: Kelli Pittman MRN: [id number] DOB/AGE: February 05, 1922 80 y.o.  Admit date: 10/14/2013 Discharge date: 10/16/2013  Primary Care Physician:  Herb Grays, MD  Discharge Diagnoses:    . Syncope and collapse . Tachycardia, paroxysmal- HR as high as 200 (Vent tachy rythm) per pacemaker check . Cardiac pacemaker in situ-implanted 2011 . HTN (hypertension) . Dementia- recently placed in assited living . Syncope  Consults: Cardiology, Dr Ladona Ridgel   Recommendations for Outpatient Follow-up:  Patient will need assistance with activities of daily living, hygiene, feeding at the assisted living facility  Patient was recommended amiodarone  BID x 1 week, then continue 200 mg daily thereafter, please check TFTs, LFTs in 4-6 weeks.  Allergies:  No Known Allergies   Discharge Medications:   Medication List         amiodarone 200 MG tablet  Commonly known as:  PACERONE  Take 1 tablet (200 mg total) by mouth 2 (two) times daily. X 1 week     amiodarone 200 MG tablet  Commonly known as:  PACERONE  Take 1 tablet (200 mg total) by mouth daily.  Start taking on:  10/23/2013     aspirin 81 MG chewable tablet  Chew 81 mg by mouth daily.     donepezil 5 MG tablet  Commonly known as:  ARICEPT  Take 5 mg by mouth at bedtime.     furosemide 40 MG tablet  Commonly known as:  LASIX  Take 40 mg by mouth daily.     memantine 10 MG tablet  Commonly known as:  NAMENDA  Take 10 mg by mouth 2 (two) times daily.     metoprolol 100 MG tablet  Commonly known as:  LOPRESSOR  Take 100 mg by mouth 2 (two) times daily.     raloxifene 60 MG tablet  Commonly known as:  EVISTA  Take 60 mg by mouth daily.         Brief H and P: For complete details please refer to admission H and P, but in brief Kelli Pittman is a 80 y.o. female with PMH of HTN, s/p PPM, dementia presented from [facility] assisted living following a syncopal episode happened during the  breakfast time this morning; She was witnessed to have an unprovoked syncopal episode. She was unresponsive for a short period of time; Patient has dementia, poor historian; but no focal neuro weaknesses, no urinary or fecal incontinence; no fever, no SOB, no chest pain, no nausea, vomiting or diarrhea  -Pacemaker interrogation: prelim atrial tachyarrythmia during syncope  Hospital Course:   Syncope and Collapse -Likely secondary to arrhythmia, otherwise patient denied any episodes of syncope, dizziness or palpitaitons  -CT head and CXR- No abnormalities  -TSH, Mg, UA normal   Tachycardia : Now stable, cardiology was consulted, pacemaker was interrogated demonstrated normal device function withruns of atrial tach vs atrial flutter with intermittent 1:1 conduction felt to be the likely cause of her syncope. Amiodarone was added. Patient was recommended amiodarone  BID x 1 week, then continue 200 mg daily thereafter, please check TFTs, LFTs in 4-6 weeks.   Dementia  -Recently placed in [facility] assisted living facility,continue regimen of memantine and donepezil.   Hypertension -Stable- continue metoprolol 100 mg BID   Hypercalcemia likely due to dehydration improved  Osteoprosis  Continue regimen of raloxifene      Day of Discharge BP 132/50  Pulse 64  Temp(Src) 97.7 F (36.5 C) (Oral)  Resp 16  Ht 5'  9" (1.753 m)  Wt 65.363 kg (144 lb 1.6 oz)  BMI 21.27 kg/m2  SpO2 97%  Physical Exam: General: Alert and awake oriented to self and person, not in any acute distress. HEENT: anicteric sclera, pupils reactive to light and accommodation CVS: S1-S2 clear no murmur rubs or gallops Chest: clear to auscultation bilaterally, no wheezing rales or rhonchi Abdomen: soft nontender, nondistended, normal bowel sounds Extremities: no cyanosis, clubbing or edema noted bilaterally Neuro: Cranial nerves II-XII intact, no focal neurological deficits   The results of  significant diagnostics from this hospitalization (including imaging, microbiology, ancillary and laboratory) are listed below for reference.    LAB RESULTS: Basic Metabolic Panel:  Recent Labs Lab 10/14/13 1111 10/15/13 0545  NA 138 144  K 3.7 4.5  CL 101 106  CO2 29 30  GLUCOSE 69* 95  BUN 19 21  CREATININE 0.96 0.98  CALCIUM 10.6* 10.3  MG  --  2.0   Liver Function Tests:  Recent Labs Lab 10/16/13 0436  AST 13  ALT 6  ALKPHOS 69  BILITOT 0.5  PROT 5.5*  ALBUMIN 3.3*   No results found for this basename: LIPASE, AMYLASE,  in the last 168 hours No results found for this basename: AMMONIA,  in the last 168 hours CBC:  Recent Labs Lab 10/14/13 1111  WBC 5.2  NEUTROABS 3.6  HGB 12.9  HCT 39.8  MCV 96.4  PLT 142*   Cardiac Enzymes: No results found for this basename: CKTOTAL, CKMB, CKMBINDEX, TROPONINI,  in the last 168 hours BNP: No components found with this basename: POCBNP,  CBG: No results found for this basename: GLUCAP,  in the last 168 hours  Significant Diagnostic Studies:  Ct Head Wo Contrast  10/14/2013   CLINICAL DATA:  Loss of consciousness.  EXAM: CT HEAD WITHOUT CONTRAST  TECHNIQUE: Contiguous axial images were obtained from the base of the skull through the vertex without intravenous contrast.  COMPARISON:  None.  FINDINGS: No acute intracranial hemorrhage. No focal mass lesion. No CT evidence of acute infarction. No midline shift or mass effect. No hydrocephalus. Basilar cisterns are patent.  Measure as cortical atrophy. There is mild periventricular white matter hypodensities.  IMPRESSION: 1. No acute intracranial findings. 2. Cortical atrophy and periventricular white matter microvascular disease   Electronically Signed   By: Genevive Bi M.D.   On: 10/14/2013 12:42   Dg Chest Portable 1 View  10/14/2013   CLINICAL DATA:  Hypertension and altered mental status  EXAM: PORTABLE CHEST - 1 VIEW  COMPARISON:  None.  FINDINGS: There is no edema  or consolidation. Heart is slightly enlarged with pulmonary vascularity within normal limits. Pacemaker leads are attached to the right atrium and right ventricle. No pneumothorax. No adenopathy.  IMPRESSION: No edema or consolidation. Heart mildly enlarged. Pacemaker present.   Electronically Signed   By: Bretta Bang M.D.   On: 10/14/2013 11:25    2D ECHO: Impressions:  - Normal LV size with mild focal basal septal hypertrophy. EF 60-65%. Aortic sclerosis without stenosis. Mild AI. Mild MR. Normal RV size and systolic function.     Disposition and Follow-up:     Discharge Instructions   Diet - low sodium heart healthy    Complete by:  As directed      Increase activity slowly    Complete by:  As directed             DISPOSITION home  DIET: Heart healthy diet   DISCHARGE FOLLOW-UP  Follow-up Information   Follow up with Lewayne Bunting, MD. Schedule an appointment as soon as possible for a visit in 2 weeks. (for hospital follow-up)    Specialty:  Cardiology   Contact information:   [address] [place] [address] [phone]       Follow up with Herb Grays, MD. Schedule an appointment as soon as possible for a visit in 2 weeks. (for hospital follow-up)    Specialty:  Family Medicine   Contact information:   [address] [facility] [place] [address] [phone]       Time spent on Discharge: 40 mins Signed:   RAI,RIPUDEEP M.D. [facility] 10/16/2013, 10:36 AM Pager: [phone]   **Disclaimer: This note was dictated with voice recognition software. Similar sounding words can inadvertently be transcribed and this note may contain transcription errors which may not have been corrected upon publication of note.**

## 2013-10-16 NOTE — Care Management Note (Signed)
    Page 1 of 1   10/16/2013     11:55:43 AM CARE MANAGEMENT NOTE 10/16/2013  Patient:  Kelli Pittman, Kelli Pittman   Account Number:  [id number]  Date Initiated:  10/16/2013  Documentation initiated by:  GRAVES-BIGELOW,Clotee Schlicker  Subjective/Objective Assessment:   Pt admitted with syncope. Plan for d/c to [facility]F.     Action/Plan:   CSW assisting with disposition needs. No needs from CM at this time.   Anticipated DC Date:  10/16/2013   Anticipated DC Plan:  ASSISTED LIVING / REST HOME      DC Planning Services  CM consult      Choice offered to / List presented to:             Status of service:  Completed, signed off Medicare Important Message given?  YES (If response is "NO", the following Medicare IM given date fields will be blank) Date Medicare IM given:  10/16/2013 Medicare IM given by:  GRAVES-BIGELOW,Brodi Nery Date Additional Medicare IM given:   Additional Medicare IM given by:    Discharge Disposition:  ASSISTED LIVING  Per UR Regulation:  Reviewed for med. necessity/level of care/duration of stay  If discussed at Long Length of Stay Meetings, dates discussed:    Comments:

## 2013-11-17 ENCOUNTER — Ambulatory Visit: Payer: Medicare Other | Admitting: Interventional Cardiology

## 2013-12-10 ENCOUNTER — Encounter (HOSPITAL_BASED_OUTPATIENT_CLINIC_OR_DEPARTMENT_OTHER): Payer: Self-pay

## 2013-12-10 ENCOUNTER — Emergency Department (HOSPITAL_BASED_OUTPATIENT_CLINIC_OR_DEPARTMENT_OTHER)
Admission: EM | Admit: 2013-12-10 | Discharge: 2013-12-10 | Disposition: A | Discharge status: Home / Self Care | Payer: Medicare Other | Attending: Emergency Medicine | Admitting: Emergency Medicine

## 2013-12-10 DIAGNOSIS — Z950 Presence of cardiac pacemaker: Secondary | ICD-10-CM | POA: Insufficient documentation

## 2013-12-10 DIAGNOSIS — Z79899 Other long term (current) drug therapy: Secondary | ICD-10-CM | POA: Insufficient documentation

## 2013-12-10 DIAGNOSIS — I10 Essential (primary) hypertension: Secondary | ICD-10-CM | POA: Insufficient documentation

## 2013-12-10 DIAGNOSIS — R0689 Other abnormalities of breathing: Secondary | ICD-10-CM

## 2013-12-10 DIAGNOSIS — Z7982 Long term (current) use of aspirin: Secondary | ICD-10-CM | POA: Diagnosis not present

## 2013-12-10 DIAGNOSIS — F0280 Dementia in other diseases classified elsewhere without behavioral disturbance: Secondary | ICD-10-CM | POA: Insufficient documentation

## 2013-12-10 DIAGNOSIS — G309 Alzheimer's disease, unspecified: Secondary | ICD-10-CM | POA: Insufficient documentation

## 2013-12-10 DIAGNOSIS — Z8739 Personal history of other diseases of the musculoskeletal system and connective tissue: Secondary | ICD-10-CM | POA: Diagnosis not present

## 2013-12-10 DIAGNOSIS — R0989 Other specified symptoms and signs involving the circulatory and respiratory systems: Secondary | ICD-10-CM | POA: Insufficient documentation

## 2013-12-10 NOTE — ED Provider Notes (Signed)
CSN: [id number]     Arrival date & time 12/10/13  1315 History   First MD Initiated Contact with Patient 12/10/13 1341     Chief Complaint  Patient presents with  . Choking     (Consider location/radiation/quality/duration/timing/severity/associated sxs/prior Treatment) HPI  Report from the nursing home is that the patient was eating pineapple and began to choke. She evidently coughed it up before coming to the emergency department. However she was sent for evaluation. The patient has no complaints. She does not recall having a choking episode. She reports that she feels very well and doesn't believe that she needs to be here.  Past Medical History  Diagnosis Date  . Pacemaker 2011    Guidant  . Alzheimer's dementia   . Hypertension   . Osteoporosis    Past Surgical History  Procedure Laterality Date  . Breast surgery    . Pacemaker insertion  2011    Guidant   Family History  Problem Relation Age of Onset  .      History  Substance Use Topics  . Smoking status: Never Smoker   . Smokeless tobacco: Never Used  . Alcohol Use: No   OB History    No data available     Review of Systems 10 Systems reviewed and are negative for acute change except as noted in the HPI.     Allergies  Review of patient's allergies indicates no known allergies.  Home Medications   Prior to Admission medications   Medication Sig Start Date End Date Taking? Authorizing Provider  amiodarone (PACERONE) 200 MG tablet Take 1 tablet (200 mg total) by mouth 2 (two) times daily. X 1 week 10/16/13   Ripudeep Jenna LuoK Rai, MD  amiodarone (PACERONE) 200 MG tablet Take 1 tablet (200 mg total) by mouth daily. 10/23/13   Ripudeep Jenna LuoK Rai, MD  aspirin 81 MG chewable tablet Chew 81 mg by mouth daily.    Historical Provider, MD  donepezil (ARICEPT) 5 MG tablet Take 5 mg by mouth at bedtime.    Historical Provider, MD  furosemide (LASIX) 40 MG tablet Take 40 mg by mouth daily.    Historical Provider, MD   memantine (NAMENDA) 10 MG tablet Take 10 mg by mouth 2 (two) times daily.    Historical Provider, MD  metoprolol (LOPRESSOR) 100 MG tablet Take 100 mg by mouth 2 (two) times daily.    Historical Provider, MD  raloxifene (EVISTA) 60 MG tablet Take 60 mg by mouth daily.    Historical Provider, MD   BP 126/51 mmHg  Pulse 59  Temp(Src) 98.6 F (37 C) (Oral)  Resp 16  Ht 5\' 9"  (1.753 m)  Wt 144 lb (65.318 kg)  BMI 21.26 kg/m2  SpO2 98% Physical Exam  Constitutional: She appears well-developed and well-nourished.  The patient is a very well-appearing [age] year old female. She shows no signs of any distress. Her mental is alert and appropriate. Her speech is clear.  HENT:  Head: Normocephalic and atraumatic.  Eyes: EOM are normal.  Neck: Neck supple.  Cardiovascular: Normal rate, regular rhythm and intact distal pulses.  Exam reveals no gallop and no friction rub.   No murmur heard. Pulmonary/Chest: Effort normal and breath sounds normal. No stridor.  Abdominal: Soft. She exhibits no distension. There is no tenderness.  Neurological: She is alert. No cranial nerve deficit. Coordination normal.  Skin: Skin is warm and dry.  Psychiatric: She has a normal mood and affect.    ED Course  Procedures (  including critical care time) Labs Review Labs Reviewed - No data to display  Imaging Review No results found.   EKG Interpretation None      MDM   Final diagnoses:  Choking episode occurring during daytime   Patient does not have any residual symptoms from the reported episode. She has no acute complaints. The patient's physical examination is for a well-appearing 80 year old female with no signs of distress or acute illness.    Arby BarretteMarcy Rita Vialpando, MD 12/10/13 1501

## 2013-12-10 NOTE — ED Notes (Signed)
Awaiting PTAR. 

## 2013-12-10 NOTE — Discharge Instructions (Signed)
Choking °Choking occurs when a food or object gets stuck in the throat or trachea, blocking the airway. If the airway is partly blocked, coughing will usually cause the food or object to come out. If the airway is completely blocked, immediate action is needed to help it come out. A complete airway blockage is life threatening because it causes breathing to stop. Choking is a true medical emergency that requires fast, appropriate action by anyone available. °SIGNS OF AIRWAY BLOCKAGE °There is a partial airway blockage if you or the person who is choking is:  °· Able to breathe and speak. °· Coughing loudly. °· Making loud noises. °There is a complete airway blockage if you or the person who is choking is:  °· Unable to breathe. °· Making soft or high-pitched sounds while breathing. °· Unable to cough or coughing weakly, ineffectively, or silently. °· Unable to cry, speak, or make sounds. °· Turning blue. °· Holding the neck with both arms. This is the universal sign of choking. °WHAT TO DO IF CHOKING OCCURS °If there is a partial airway blockage, allow coughing to clear the airway. Do not try to drink until the food or object comes out. If someone else has a partial airway blockage, do not interfere. Stay with him or her and watch for signs of complete airway blockage until the food or object comes out.  °If there is a complete airway blockage or if there is a partial airway blockage and the food or object does not come out, perform abdominal thrusts (also referred to as the Heimlich maneuver). Abdominal thrusts are used to create an artificial cough to try to clear the airway. Performing abdominal thrusts is part of a series of steps that should be done to help someone who is choking. Abdominal thrusts are usually done by someone else, but if you are alone, you can perform abdominal thrusts on yourself. Follow the procedure below that best fits your situation.  °IF SOMEONE ELSE IS CHOKING: °For a conscious adult:    °1. Ask the person whether he or she is choking. If the person nods, continue to step 2. °2. Stand or kneel behind the person and lean him or her forward slightly. °3. Make a fist with 1 hand, put your arms around the person, and grasp your fist with your other hand. Place the thumb side of your fist in the person's abdomen, just below the ribs. °4. Press inward and upward with both hands. °5. Repeat this maneuver until the object comes out and the person is able to breathe or until the person loses consciousness. °For an unconscious adult: °1. Shout for help. If someone responds, have him or her call local emergency services (911 in [place] If no one responds, call local emergency services yourself if possible. °2. Begin CPR, starting with compressions. Every time you open the airway to give rescue breaths, open the person's mouth. If you can see the food or object and it can be easily pulled out, remove it with your fingers. °3. After 5 cycles or 2 minutes of CPR, call local emergency services (911 in [place] if you or someone else did not already call. °For a conscious adult who is obese or in the later stages of pregnancy: °Abdominal thrusts may not be effective when helping people who are in the later stages of pregnancy or who are obese. In these instances, chest thrusts can be used.  °1. Ask the person whether he or she is choking. If the person   nods and has signs of complete airway blockage, continue to step 2. °2. Stand behind the person and wrap your arms around his or her chest (with your arms under the person's armpits). °3. Make a fist with 1 hand. Place the thumb side of your fist on the middle of the person's breastbone. °4. Grab your fist with your other hand and thrust backward. Continue this until the object comes out or until the person becomes unconscious. °For an unconscious adult who is obese or in the later stages of pregnancy:  °1. Shout for help. If someone responds, have him or her call local  emergency services (911 in [place] If no one responds, call local emergency services yourself if possible. °2. Begin CPR, starting with compressions. Every time you open the airway to give rescue breaths, open the person's mouth. If you can see the food or object and it can be easily pulled out, remove it with your fingers. °3. After 5 cycles or 2 minutes of CPR, call local emergency services (911 in [place] if you or someone else did not already call. °Note that abdominal thrusts (below the rib cage) should be used for a pregnant woman if possible. This should be possible until the later stages of pregnancy when there is no longer enough room between the enlarging uterus and the rib cage to perform the maneuver. At that point, chest thrusts must be used as described. °IF YOU ARE CHOKING: °1. Call local emergency services (911 in [place] if near a landline. Do not worry about communicating what is happening. Do not hang up the phone. Someone may be sent to help you anyway. °2. Make a fist with 1 hand. Put the thumb side of the fist against your stomach, just above the belly button and well below the breastbone. If you are pregnant or obese, put your fist on your chest instead, just below the breastbone and just above your lowest ribs. °3. Hold your fist with your other hand and bend over a hard surface, such as a table or chair. °4. Forcefully push your fist in and up. °5. Continue to do this until the food or object comes out. °PREVENTION  °To be prepared if choking occurs, learn how to correctly perform abdominal thrusts and give CPR by taking a certified first-aid training course.  °SEEK IMMEDIATE MEDICAL CARE IF: °· You have a fever after choking stops. °· You have problems breathing after choking stops. °· You received the Heimlich maneuver. °MAKE SURE YOU: °· Understand these instructions. °· Watch your condition. °· Get help right away if you are not doing well or get worse. °Document Released: 02/23/2004 Document  Revised: 06/01/2013 Document Reviewed: 08/28/2011 °ExitCare® Patient Information ©2015 [employer]. This information is not intended to replace advice given to you by your health care provider. Make sure you discuss any questions you have with your health care provider. ° °

## 2013-12-10 NOTE — ED Notes (Signed)
Per nursing home staff pt was eating pineapple, got choked and coughed pineapple up.

## 2013-12-10 NOTE — ED Notes (Signed)
MD at bedside. 

## 2013-12-10 NOTE — ED Notes (Signed)
Pt's daughter Newt LukesCynthia Williams arrived and requested to take the pt back to the SNF by her POV.

## 2013-12-17 ENCOUNTER — Ambulatory Visit (INDEPENDENT_AMBULATORY_CARE_PROVIDER_SITE_OTHER): Payer: Medicare Other | Admitting: Cardiology

## 2013-12-17 ENCOUNTER — Encounter: Payer: Self-pay | Admitting: Cardiology

## 2013-12-17 VITALS — BP 124/60 | HR 59 | Ht 69.0 in | Wt 141.0 lb

## 2013-12-17 DIAGNOSIS — I10 Essential (primary) hypertension: Secondary | ICD-10-CM

## 2013-12-17 DIAGNOSIS — R55 Syncope and collapse: Secondary | ICD-10-CM

## 2013-12-17 LAB — COMPREHENSIVE METABOLIC PANEL
ALT: 11 U/L (ref 0–35)
AST: 20 U/L (ref 0–37)
Albumin: 4 g/dL (ref 3.5–5.2)
Alkaline Phosphatase: 80 U/L (ref 39–117)
BUN: 20 mg/dL (ref 6–23)
CO2: 26 mEq/L (ref 19–32)
Calcium: 10.4 mg/dL (ref 8.4–10.5)
Chloride: 106 mEq/L (ref 96–112)
Creatinine, Ser: 1.1 mg/dL (ref 0.4–1.2)
GFR: 48.13 mL/min — ABNORMAL LOW (ref >60.00)
Glucose, Bld: 135 mg/dL — ABNORMAL HIGH (ref 70–99)
Potassium: 3.5 mEq/L (ref 3.5–5.1)
Sodium: 140 mEq/L (ref 135–145)
Total Bilirubin: 0.7 mg/dL (ref 0.2–1.2)
Total Protein: 6.2 g/dL (ref 6.0–8.3)

## 2013-12-17 MED ORDER — METOPROLOL TARTRATE 50 MG PO TABS: 50.0000 mg | ORAL_TABLET | Freq: Two times a day (BID) | ORAL | Status: DC

## 2013-12-17 NOTE — Progress Notes (Signed)
Patient ID: Kelli Pittman, female   DOB: 1923-01-11, 80 y.o.   MRN: [id number]     Patient Name: Kelli Pittman Date of Encounter: 12/17/2013  Primary Care Provider:  No PCP Per Patient Primary Cardiologist:  Lars MassonNELSON, Kharizma Lesnick H   Problem List   Past Medical History  Diagnosis Date  . Pacemaker 2011    Guidant  . Alzheimer's dementia   . Hypertension   . Osteoporosis    Past Surgical History  Procedure Laterality Date  . Breast surgery    . Pacemaker insertion  2011    Guidant    Allergies  No Known Allergies  HPI  This is a 80 y.o.,pleasant female with a past medical history significant for dementia. 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. She was recently placed in assisted living in [place], [place]. Her daughters felt that she was unable to care for herself. The pt's history is obtained from her daughter who was present through the interview and exam. The pt herself is awake and alert and pleasant but unable to give any details of today's events or her PMH. The pt apparently was at breakfast at her assisted living facility when she suddenly collapsed into her plate. There was no seizure activity noted. She was brought to the ER by EMS. Pacer interrogation revealed "V-tachy" rhythm 165-200. When I interviewed her she was completely asymptomatic. Her daughter tells me she [facility] and for some recent low extremity edema. I have requested those records.   12/17/2013 - this is a first follow-up after the hospital stay. The patient denies any symptoms however person accompanying her from nursing home states that she has been having some dizziness. No other symptoms. No more syncopal episodes.  Home Medications  Prior to Admission medications   Medication Sig Start Date End Date Taking? Authorizing Provider  amiodarone  (PACERONE) 200 MG tablet Take 1 tablet (200 mg total) by mouth daily. 10/23/13   Ripudeep Jenna LuoK Rai, MD  aspirin 81 MG chewable tablet Chew 81 mg by mouth daily.    Historical Provider, MD  donepezil (ARICEPT) 5 MG tablet Take 5 mg by mouth at bedtime.    Historical Provider, MD  furosemide (LASIX) 40 MG tablet Take 40 mg by mouth daily.    Historical Provider, MD  memantine (NAMENDA) 10 MG tablet Take 10 mg by mouth 2 (two) times daily.    Historical Provider, MD  metoprolol (LOPRESSOR) 100 MG tablet Take 100 mg by mouth 2 (two) times daily.    Historical Provider, MD  raloxifene (EVISTA) 60 MG tablet Take 60 mg by mouth daily.    Historical Provider, MD    Family History  Family History  Problem Relation Age of Onset  .       Social History  History   Social History  . Marital Status: Widowed    Spouse Name: N/A    Number of Children: N/A  . Years of Education: N/A   Occupational History  . Not on file.   Social History Main Topics  . Smoking status: Never Smoker   . Smokeless tobacco: Never Used  . Alcohol Use: No  . Drug Use: No  . Sexual Activity: Not on file   Other Topics Concern  . Not on file   Social History Narrative     Review  of Systems, as per HPI, otherwise negative General:  No chills, fever, night sweats or weight changes.  Cardiovascular:  No chest pain, dyspnea on exertion, edema, orthopnea, palpitations, paroxysmal nocturnal dyspnea. Dermatological: No rash, lesions/masses Respiratory: No cough, dyspnea Urologic: No hematuria, dysuria Abdominal:   No nausea, vomiting, diarrhea, bright red blood per rectum, melena, or hematemesis Neurologic:  No visual changes, wkns, changes in mental status. All other systems reviewed and are otherwise negative except as noted above.  Physical Exam  There were no vitals taken for this visit.  General: Pleasant, NAD Psych: Normal affect. Neuro: Alert and oriented X 3. Moves all extremities spontaneously. HEENT:  Normal  Neck: Supple without bruits or JVD. Lungs:  Resp regular and unlabored, CTA. Heart: RRR no s3, s4, or murmurs. Abdomen: Soft, non-tender, non-distended, BS + x 4.  Extremities: No clubbing, cyanosis or edema. DP/PT/Radials 2+ and equal bilaterally.  Labs:  No results for input(s): CKTOTAL, CKMB, TROPONINI in the last 72 hours. Lab Results  Component Value Date   WBC 5.2 10/14/2013   HGB 12.9 10/14/2013   HCT 39.8 10/14/2013   MCV 96.4 10/14/2013   PLT 142* 10/14/2013    No results found for: DDIMER Invalid input(s): POCBNP    Component Value Date/Time   NA 144 10/15/2013 0545   K 4.5 10/15/2013 0545   CL 106 10/15/2013 0545   CO2 30 10/15/2013 0545   GLUCOSE 95 10/15/2013 0545   BUN 21 10/15/2013 0545   CREATININE 0.98 10/15/2013 0545   CALCIUM 10.3 10/15/2013 0545   PROT 5.5* 10/16/2013 0436   ALBUMIN 3.3* 10/16/2013 0436   AST 13 10/16/2013 0436   ALT 6 10/16/2013 0436   ALKPHOS 69 10/16/2013 0436   BILITOT 0.5 10/16/2013 0436   GFRNONAA 50* 10/15/2013 0545   GFRAA 58* 10/15/2013 0545   No results found for: CHOL, HDL, LDLCALC, TRIG  Accessory Clinical Findings  Echocardiogram - 09/2013 Left ventricle: The cavity size was normal. There was mild focal basal hypertrophy of the septum. Systolic function was normal. The estimated ejection fraction was in the range of 60% to 65%. Wall motion was normal; there were no regional wall motion abnormalities. Doppler parameters are consistent with abnormal left ventricular relaxation (grade 1 diastolic dysfunction). - Aortic valve: Sclerosis without stenosis. There was mild regurgitation. - Mitral valve: Mildly calcified annulus. There was mild regurgitation. - Right ventricle: The cavity size was normal. Pacer wire or catheter noted in right ventricle. Systolic function was normal. - Tricuspid valve: Peak RV-RA gradient (S): 23 mm Hg. - Systemic veins: IVC not visualized.  Impressions:  -  Normal LV size with mild focal basal septal hypertrophy. EF 60-65%. Aortic sclerosis without stenosis. Mild AI. Mild MR. Normal RV size and systolic function.  ECG -      Assessment & Plan  Principal Problem:  Syncope and collapse Active Problems:  Tachycardia, paroxysmal- HR as high as 200  per pacemaker check  Cardiac pacemaker in situ-implanted 2011  HTN (hypertension)  Dementia- recently placed in assited living  1. Atrial flutter with intermittent 1:1 conduction Doing well with amiodarone, with some bradycardia despite having atrial pacemaker, her blood pressure is low for her age, I will decrease metoprolol 100 mg to 50 mg by mouth twice a day. We'll continue amiodarone 200 mg daily, we will check liver functions tests today. Considering her age, prior syncopal episode dizziness and high risk of fall am inclined not to start anticoagulation.  Follow up in 2 months. At  that time we will also perform another pacemaker interrogation to figure out atrial flutter burden that will help us decide about anticoagulation. Her CHAD-VASc score is high and is at least 4.  Lars MassonNELSON, Congetta Odriscoll H, MD, [facility] 12/17/2013, 10:03 AM

## 2013-12-17 NOTE — Patient Instructions (Signed)
Your physician has recommended you make the following change in your medication:   START TAKING METOPROLOL 50 MG TWICE DAILY     Your physician recommends that you return for lab work in: TODAY (CMET)     Your physician recommends that you schedule a follow-up appointment in: 2 MONTHS WITH DR Delton SeeNELSON

## 2014-01-05 ENCOUNTER — Telehealth: Payer: Self-pay | Admitting: Cardiology

## 2014-01-05 NOTE — Telephone Encounter (Signed)
New Msg   Huntley DecSara of [facility] calling about pt BP trends: this morning before meds 125/66 after meds 98/60 and complaining of dizziness. If a new order for meds needs to be faxed please send to [phone]. Huntley DecSara can be reached at [phone].

## 2014-01-14 ENCOUNTER — Ambulatory Visit: Payer: Medicare Other | Admitting: Interventional Cardiology

## 2014-02-08 ENCOUNTER — Encounter: Payer: Self-pay | Admitting: Cardiology

## 2014-02-08 ENCOUNTER — Ambulatory Visit (INDEPENDENT_AMBULATORY_CARE_PROVIDER_SITE_OTHER): Payer: Medicare Other | Admitting: *Deleted

## 2014-02-08 ENCOUNTER — Encounter: Payer: Self-pay | Admitting: Internal Medicine

## 2014-02-08 ENCOUNTER — Ambulatory Visit (INDEPENDENT_AMBULATORY_CARE_PROVIDER_SITE_OTHER): Payer: Medicare Other | Admitting: Cardiology

## 2014-02-08 VITALS — BP 108/54 | HR 64 | Ht 69.0 in | Wt 141.0 lb

## 2014-02-08 DIAGNOSIS — I952 Hypotension due to drugs: Secondary | ICD-10-CM

## 2014-02-08 DIAGNOSIS — I495 Sick sinus syndrome: Secondary | ICD-10-CM | POA: Diagnosis not present

## 2014-02-08 DIAGNOSIS — R296 Repeated falls: Secondary | ICD-10-CM

## 2014-02-08 DIAGNOSIS — I4892 Unspecified atrial flutter: Secondary | ICD-10-CM

## 2014-02-08 DIAGNOSIS — Z950 Presence of cardiac pacemaker: Secondary | ICD-10-CM

## 2014-02-08 LAB — MDC_IDC_ENUM_SESS_TYPE_INCLINIC
Brady Statistic RA Percent Paced: 31 %
Brady Statistic RV Percent Paced: 20 %
Implantable Pulse Generator Serial Number: 153337
Lead Channel Pacing Threshold Amplitude: 0.7 V
Lead Channel Pacing Threshold Amplitude: 1.1 V
Lead Channel Setting Pacing Amplitude: 1.6 V
Lead Channel Setting Pacing Amplitude: 2 V
Lead Channel Setting Pacing Pulse Width: 0.4 ms
Lead Channel Setting Sensing Sensitivity: 1 mV
MDC IDC MSMT LEADCHNL RA IMPEDANCE VALUE: 520 Ohm
MDC IDC MSMT LEADCHNL RA PACING THRESHOLD PULSEWIDTH: 0.4 ms
MDC IDC MSMT LEADCHNL RA SENSING INTR AMPL: 3 mV — AB
MDC IDC MSMT LEADCHNL RV IMPEDANCE VALUE: 370 Ohm
MDC IDC MSMT LEADCHNL RV PACING THRESHOLD PULSEWIDTH: 0.4 ms
MDC IDC MSMT LEADCHNL RV SENSING INTR AMPL: 2.9 mV
MDC IDC SESS DTM: 20160111050000

## 2014-02-08 MED ORDER — METOPROLOL TARTRATE 25 MG PO TABS: 12.5000 mg | ORAL_TABLET | Freq: Every day | ORAL | Status: DC

## 2014-02-08 MED ORDER — METOPROLOL SUCCINATE ER 25 MG PO TB24: 12.5000 mg | ORAL_TABLET | Freq: Every day | ORAL | Status: DC

## 2014-02-08 NOTE — Progress Notes (Signed)
Patient ID: Kelli Pittman, female   DOB: 25-Jan-1923, 81 y.o.   MRN: [id number]    Patient Name: Kelli Pittman Date of Encounter: 02/08/2014  Primary Care Provider:  No PCP Per Patient Primary Cardiologist:  Lars Masson  Problem List   Past Medical History  Diagnosis Date  . Pacemaker 2011    Guidant  . Alzheimer's dementia   . Hypertension   . Osteoporosis    Past Surgical History  Procedure Laterality Date  . Breast surgery    . Pacemaker insertion  2011    Guidant   Allergies  No Known Allergies  HPI  This is a 81 y.o.,pleasant female with a past medical history significant for dementia. 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. She was recently placed in assisted living in [facility], she was previously living alone in [place]. Her daughters felt that she was unable to care for herself. The pt's history is obtained from her daughter who was present through the interview and exam. The pt herself is awake and alert and pleasant but unable to give any details of today's events or her PMH. The pt apparently was at breakfast at her assisted living facility when she suddenly collapsed into her plate. There was no seizure activity noted. She was brought to the ER by EMS. Pacer interrogation revealed "V-tachy" rhythm 165-200. When I interviewed her she was completely asymptomatic. Her daughter tells me she was just recently seen by [facility] to get established and for some recent low extremity edema. I have requested those records.   12/17/2013 - this is a first follow-up after the hospital stay. The patient denies any symptoms however person accompanying her from nursing home states that she has been having some dizziness. No other symptoms. No more syncopal episodes.  02/08/2014 - the patient is coming after 2 months, she is a poor historian as she has dementia but people from her nursing home are coming  with her and stated that she fell yesterday and was taken to the ER. She hurt her head with minimal bruising and bleeding but other findings were otherwise negative. Patient has no recollection of the event. She denies any palpitation chest pain or shortness of breath. People from nursing home state that today she feels a little bit down but otherwise she is up and walking every day.  Home Medications  Prior to Admission medications   Medication Sig Start Date End Date Taking? Authorizing Provider  amiodarone (PACERONE) 200 MG tablet Take 1 tablet (200 mg total) by mouth daily. 10/23/13   Ripudeep Jenna Luo, MD  aspirin 81 MG chewable tablet Chew 81 mg by mouth daily.    Historical Provider, MD  donepezil (ARICEPT) 5 MG tablet Take 5 mg by mouth at bedtime.    Historical Provider, MD  furosemide (LASIX) 40 MG tablet Take 40 mg by mouth daily.    Historical Provider, MD  memantine (NAMENDA) 10 MG tablet Take 10 mg by mouth 2 (two) times daily.    Historical Provider, MD  metoprolol (LOPRESSOR) 100 MG tablet Take 100 mg by mouth 2 (two) times daily.    Historical Provider, MD  raloxifene (EVISTA) 60 MG tablet Take 60 mg by mouth daily.    Historical Provider, MD    Family History  Family History  Problem Relation Age of Onset  .       Social History  History  Social History  . Marital Status: Widowed    Spouse Name: N/A    Number of Children: N/A  . Years of Education: N/A   Occupational History  . Not on file.   Social History Main Topics  . Smoking status: Never Smoker   . Smokeless tobacco: Never Used  . Alcohol Use: No  . Drug Use: No  . Sexual Activity: Not on file   Other Topics Concern  . Not on file   Social History Narrative     Review of Systems, as per HPI, otherwise negative General:  No chills, fever, night sweats or weight changes.  Cardiovascular:  No chest pain, dyspnea on exertion, edema, orthopnea, palpitations, paroxysmal nocturnal  dyspnea. Dermatological: No rash, lesions/masses Respiratory: No cough, dyspnea Urologic: No hematuria, dysuria Abdominal:   No nausea, vomiting, diarrhea, bright red blood per rectum, melena, or hematemesis Neurologic:  No visual changes, wkns, changes in mental status. All other systems reviewed and are otherwise negative except as noted above.  Physical Exam  BP 108/54, HR 64 General: Pleasant, NAD Psych: Normal affect. Neuro: Alert and oriented X 1. Moves all extremities spontaneously. HEENT: Normal  Neck: Supple without bruits or JVD. Lungs:  Resp regular and unlabored, CTA. Heart: RRR no s3, s4, or murmurs. Abdomen: Soft, non-tender, non-distended, BS + x 4.  Extremities: No clubbing, cyanosis or edema. DP/PT/Radials 2+ and equal bilaterally.  Labs:  No results for input(s): CKTOTAL, CKMB, TROPONINI in the last 72 hours. Lab Results  Component Value Date   WBC 5.2 10/14/2013   HGB 12.9 10/14/2013   HCT 39.8 10/14/2013   MCV 96.4 10/14/2013   PLT 142* 10/14/2013    No results found for: DDIMER Invalid input(s): POCBNP    Component Value Date/Time   NA 140 12/17/2013 1101   K 3.5 12/17/2013 1101   CL 106 12/17/2013 1101   CO2 26 12/17/2013 1101   GLUCOSE 135* 12/17/2013 1101   BUN 20 12/17/2013 1101   CREATININE 1.1 12/17/2013 1101   CALCIUM 10.4 12/17/2013 1101   PROT 6.2 12/17/2013 1101   ALBUMIN 4.0 12/17/2013 1101   AST 20 12/17/2013 1101   ALT 11 12/17/2013 1101   ALKPHOS 80 12/17/2013 1101   BILITOT 0.7 12/17/2013 1101   GFRNONAA 50* 10/15/2013 0545   GFRAA 58* 10/15/2013 0545   No results found for: CHOL, HDL, LDLCALC, TRIG  Accessory Clinical Findings  Echocardiogram - 09/2013 Left ventricle: The cavity size was normal. There was mild focal basal hypertrophy of the septum. Systolic function was normal. The estimated ejection fraction was in the range of 60% to 65%. Wall motion was normal; there were no regional wall  motion abnormalities. Doppler parameters are consistent with abnormal left ventricular relaxation (grade 1 diastolic dysfunction). - Aortic valve: Sclerosis without stenosis. There was mild regurgitation. - Mitral valve: Mildly calcified annulus. There was mild regurgitation. - Right ventricle: The cavity size was normal. Pacer wire or catheter noted in right ventricle. Systolic function was normal. - Tricuspid valve: Peak RV-RA gradient (S): 23 mm Hg. - Systemic veins: IVC not visualized.  Impressions:  - Normal LV size with mild focal basal septal hypertrophy. EF 60-65%. Aortic sclerosis without stenosis. Mild AI. Mild MR. Normal RV size and systolic function.   Assessment & Plan  Principal Problem:  Syncope and collapse Active Problems:  Tachycardia, paroxysmal- HR as high as 200  per pacemaker check  Cardiac pacemaker in situ-implanted 2011  HTN (hypertension)  Dementia- recently placed in assited  living  1. Atrial flutter with intermittent 1:1 conduction Doing well with amiodarone, we interrogated her pacemaker today and is functioning well with father he did last for another 5 years. Most importantly the patient had no more episodes of arrhythmias since last episode in October. These just support my assumption that anticoagulation would cause higher risk than benefit in her particular case as she has very infrequent episodes of arrhythmia and has frequent falls. Her CHAD-VASc score is high and is at least 4.  2. Hypotension - these might be contributing to her falls we will hold Lasix as she has no signs of congestive heart failure and further decrease Toprol-XL to 12.5 mg daily. We will follow in 3 months.   Lars Masson, MD, [facility] 02/08/2014, 9:48 AM

## 2014-02-08 NOTE — Progress Notes (Signed)
Pacemaker check in clinic. Normal device function. Thresholds, sensing, impedances consistent with previous measurements. Device programmed to maximize longevity. 120 mode switches---0%, longest 923min14sec---no anti-coag due to fall risk. 2 HVRs w/ EGM, both show RVR. Device programmed at appropriate safety margins. Histogram distribution appropriate for patient activity level. Device programmed to optimize intrinsic conduction. Estimated longevity >5.5061yrs. Tentative ROV w/ Dr. Ladona Ridgelaylor to establish.

## 2014-02-08 NOTE — Patient Instructions (Addendum)
Your physician has recommended you make the following change in your medication:  1) STOP LASIX 2) DECREASE Metoprolol to 12.5 mg daily  Your physician recommends that you schedule a follow-up appointment in: 3 months with Dr. Delton SeeNelson.

## 2014-05-10 ENCOUNTER — Ambulatory Visit: Payer: Medicare Other | Admitting: Cardiology

## 2014-05-27 ENCOUNTER — Encounter: Payer: Self-pay | Admitting: Cardiology

## 2014-05-27 ENCOUNTER — Ambulatory Visit (INDEPENDENT_AMBULATORY_CARE_PROVIDER_SITE_OTHER): Payer: Medicare Other | Admitting: Cardiology

## 2014-05-27 VITALS — BP 132/54 | HR 64 | Ht 69.0 in | Wt 143.0 lb

## 2014-05-27 DIAGNOSIS — Z950 Presence of cardiac pacemaker: Secondary | ICD-10-CM

## 2014-05-27 DIAGNOSIS — I10 Essential (primary) hypertension: Secondary | ICD-10-CM | POA: Diagnosis not present

## 2014-05-27 DIAGNOSIS — I4892 Unspecified atrial flutter: Secondary | ICD-10-CM

## 2014-05-27 NOTE — Progress Notes (Signed)
Patient ID: Kelli Pittman, female   DOB: [birth year], 81 y.o.   MRN: [id number]    Patient Name: Kelli Pittman Date of Encounter: 05/27/2014  Primary Care Provider:  No PCP Per Patient Primary Cardiologist:  Lars Masson  Problem List   Past Medical History  Diagnosis Date  . Pacemaker 2011    Guidant  . Alzheimer's dementia   . Hypertension   . Osteoporosis    Past Surgical History  Procedure Laterality Date  . Breast surgery    . Pacemaker insertion  2011    Guidant   Allergies  No Known Allergies  HPI  This is a 81 y.o.,pleasant female with a past medical history significant for dementia. 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. She was recently placed in assisted living in [facility], she was previously living alone in [place]. Her daughters felt that she was unable to care for herself. The pt's history is obtained from her daughter who was present through the interview and exam. The pt herself is awake and alert and pleasant but unable to give any details of today's events or her PMH. The pt apparently was at breakfast at her assisted living facility when she suddenly collapsed into her plate. There was no seizure activity noted. She was brought to the ER by EMS. Pacer interrogation revealed "V-tachy" rhythm 165-200. When I interviewed her she was completely asymptomatic. Her daughter tells me she was just recently seen by [facility] to get established and for some recent low extremity edema. I have requested those records.   12/17/2013 - this is a first follow-up after the hospital stay. The patient denies any symptoms however person accompanying her from nursing home states that she has been having some dizziness. No other symptoms. No more syncopal episodes.  02/08/2014 - the patient is coming after 2 months, she is a poor historian as she has dementia but people from her nursing home are coming  with her and stated that she fell yesterday and was taken to the ER. She hurt her head with minimal bruising and bleeding but other findings were otherwise negative. Patient has no recollection of the event. She denies any palpitation chest pain or shortness of breath. People from nursing home state that today she feels a little bit down but otherwise she is up and walking every day.  05/27/2014 - patient is coming after 3 months, she states she feels great, she denies any falls, chest pain shortness of breath or dizziness. Per caretaker she is hypotensive in the mornings but hasn't felt recently. She walks with a walker and doesn't have any limitations in regards to shortness of breath. She denies any palpitations or syncope.  Home Medications  Prior to Admission medications   Medication Sig Start Date End Date Taking? Authorizing Provider  amiodarone (PACERONE) 200 MG tablet Take 1 tablet (200 mg total) by mouth daily. 10/23/13   Ripudeep Jenna Luo, MD  aspirin 81 MG chewable tablet Chew 81 mg by mouth daily.    Historical Provider, MD  donepezil (ARICEPT) 5 MG tablet Take 5 mg by mouth at bedtime.    Historical Provider, MD  furosemide (LASIX) 40 MG tablet Take 40 mg by mouth daily.    Historical Provider, MD  memantine (NAMENDA) 10 MG tablet Take 10 mg by mouth 2 (two) times daily.    Historical Provider, MD  metoprolol (LOPRESSOR) 100 MG tablet Take  100 mg by mouth 2 (two) times daily.    Historical Provider, MD  raloxifene (EVISTA) 60 MG tablet Take 60 mg by mouth daily.    Historical Provider, MD    Family History  Family History  Problem Relation Age of Onset  .       Social History  History   Social History  . Marital Status: Widowed    Spouse Name: N/A  . Number of Children: N/A  . Years of Education: N/A   Occupational History  . Not on file.   Social History Main Topics  . Smoking status: Never Smoker   . Smokeless tobacco: Never Used  . Alcohol Use: No  . Drug Use: No   . Sexual Activity: Not on file   Other Topics Concern  . Not on file   Social History Narrative     Review of Systems, as per HPI, otherwise negative General:  No chills, fever, night sweats or weight changes.  Cardiovascular:  No chest pain, dyspnea on exertion, edema, orthopnea, palpitations, paroxysmal nocturnal dyspnea. Dermatological: No rash, lesions/masses Respiratory: No cough, dyspnea Urologic: No hematuria, dysuria Abdominal:   No nausea, vomiting, diarrhea, bright red blood per rectum, melena, or hematemesis Neurologic:  No visual changes, wkns, changes in mental status. All other systems reviewed and are otherwise negative except as noted above.  Physical Exam  BP 108/54, HR 64 General: Pleasant, NAD Psych: Normal affect. Neuro: Alert and oriented X 1. Moves all extremities spontaneously. HEENT: Normal  Neck: Supple without bruits or JVD. Lungs:  Resp regular and unlabored, CTA. Heart: RRR no s3, s4, or murmurs. Abdomen: Soft, non-tender, non-distended, BS + x 4.  Extremities: No clubbing, cyanosis or edema. DP/PT/Radials 2+ and equal bilaterally.  Labs:  No results for input(s): CKTOTAL, CKMB, TROPONINI in the last 72 hours. Lab Results  Component Value Date   WBC 5.2 10/14/2013   HGB 12.9 10/14/2013   HCT 39.8 10/14/2013   MCV 96.4 10/14/2013   PLT 142* 10/14/2013    No results found for: DDIMER Invalid input(s): POCBNP    Component Value Date/Time   NA 140 12/17/2013 1101   K 3.5 12/17/2013 1101   CL 106 12/17/2013 1101   CO2 26 12/17/2013 1101   GLUCOSE 135* 12/17/2013 1101   BUN 20 12/17/2013 1101   CREATININE 1.1 12/17/2013 1101   CALCIUM 10.4 12/17/2013 1101   PROT 6.2 12/17/2013 1101   ALBUMIN 4.0 12/17/2013 1101   AST 20 12/17/2013 1101   ALT 11 12/17/2013 1101   ALKPHOS 80 12/17/2013 1101   BILITOT 0.7 12/17/2013 1101   GFRNONAA 50* 10/15/2013 0545   GFRAA 58* 10/15/2013 0545   No results found for: CHOL, HDL, LDLCALC,  TRIG  Accessory Clinical Findings  Echocardiogram - 09/2013 Left ventricle: The cavity size was normal. There was mild focal basal hypertrophy of the septum. Systolic function was normal. The estimated ejection fraction was in the range of 60% to 65%. Wall motion was normal; there were no regional wall motion abnormalities. Doppler parameters are consistent with abnormal left ventricular relaxation (grade 1 diastolic dysfunction). - Aortic valve: Sclerosis without stenosis. There was mild regurgitation. - Mitral valve: Mildly calcified annulus. There was mild regurgitation. - Right ventricle: The cavity size was normal. Pacer wire or catheter noted in right ventricle. Systolic function was normal. - Tricuspid valve: Peak RV-RA gradient (S): 23 mm Hg. - Systemic veins: IVC not visualized.  Impressions:  - Normal LV size with mild focal basal  septal hypertrophy. EF 60-65%. Aortic sclerosis without stenosis. Mild AI. Mild MR. Normal RV size and systolic function.   Assessment & Plan  Principal Problem:  Syncope and collapse Active Problems:  Tachycardia, paroxysmal- HR as high as 200  per pacemaker check  Cardiac pacemaker in situ-implanted 2011  HTN (hypertension)  Dementia- recently placed in assited living  1. Atrial flutter with intermittent 1:1 conduction Doing well with amiodarone, we interrogated her pacemaker today and is functioning well. She had no episode of flutter since October 2015. Today she is in SR, we will continue the same dose of amiodarone. These just support my assumption that anticoagulation would cause higher risk than benefit in her particular case as she has very infrequent episodes of arrhythmia and has frequent falls. Her CHAD-VASc score is high and is at least 4.  2. Hypotension - I will discontinue her Toprol XL.  Follow-up in 6 months.   Lars Masson, MD, [facility] 05/27/2014, 12:11 PM

## 2014-05-27 NOTE — Patient Instructions (Signed)
Your physician has recommended you make the following change in your medication:   STOP TOPROL XL NOW      Your physician wants you to follow-up in: 6 MONTHS WITH DR Johnell ComingsNELSON You will receive a reminder letter in the mail two months in advance. If you don't receive a letter, please call our office to schedule the follow-up appointment.

## 2014-08-24 ENCOUNTER — Encounter: Payer: Self-pay | Admitting: *Deleted

## 2014-12-02 ENCOUNTER — Ambulatory Visit: Payer: Medicare Other | Admitting: Cardiology

## 2014-12-06 ENCOUNTER — Ambulatory Visit (INDEPENDENT_AMBULATORY_CARE_PROVIDER_SITE_OTHER): Admitting: Cardiology

## 2014-12-06 ENCOUNTER — Encounter: Payer: Self-pay | Admitting: Cardiology

## 2014-12-06 VITALS — BP 120/64 | HR 70 | Ht 69.0 in | Wt 132.0 lb

## 2014-12-06 DIAGNOSIS — I4892 Unspecified atrial flutter: Secondary | ICD-10-CM

## 2014-12-06 DIAGNOSIS — I951 Orthostatic hypotension: Secondary | ICD-10-CM

## 2014-12-06 NOTE — Progress Notes (Signed)
Patient ID: Kelli Pittman, female   DOB: 12-Oct-1922, 81 y.o.   MRN: [id number]    Patient Name: Kelli Pittman Date of Encounter: 12/06/2014  Primary Care Provider:  No PCP Per Patient Primary Cardiologist:  Lars Masson  Problem List   Past Medical History  Diagnosis Date  . Pacemaker 2011    Guidant  . Alzheimer's dementia   . Hypertension   . Osteoporosis    Past Surgical History  Procedure Laterality Date  . Breast surgery    . Pacemaker insertion  2011    Guidant   Allergies  No Known Allergies  HPI  This is a 81 y.o.,pleasant female with a past medical history significant for dementia. 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. She was recently placed in assisted living in [facility], she was previously living alone in [place]. Her daughters felt that she was unable to care for herself. The pt's history is obtained from her daughter who was present through the interview and exam. The pt herself is awake and alert and pleasant but unable to give any details of today's events or her PMH. The pt apparently was at breakfast at her assisted living facility when she suddenly collapsed into her plate. There was no seizure activity noted. She was brought to the ER by EMS. Pacer interrogation revealed "V-tachy" rhythm 165-200. When I interviewed her she was completely asymptomatic. Her daughter tells me she was just recently seen by [facility] to get established and for some recent low extremity edema. I have requested those records.   12/17/2013 - this is a first follow-up after the hospital stay. The patient denies any symptoms however person accompanying her from nursing home states that she has been having some dizziness. No other symptoms. No more syncopal episodes.  02/08/2014 - the patient is coming after 2 months, she is a poor historian as she has dementia but people from her nursing home are coming  with her and stated that she fell yesterday and was taken to the ER. She hurt her head with minimal bruising and bleeding but other findings were otherwise negative. Patient has no recollection of the event. She denies any palpitation chest pain or shortness of breath. People from nursing home state that today she feels a little bit down but otherwise she is up and walking every day.  12/06/2014 - patient is coming after 6 months, she states she feels great, she denies any falls, chest pain shortness of breath or dizziness. Per caretaker she is hypotensive in the mornings but hasn't felt recently. She walks with a walker and doesn't have any limitations in regards to shortness of breath. She denies any palpitations or syncope. Her appetite is poor, she is skipping breakfast and sometimes lunch.   Home Medications  Prior to Admission medications   Medication Sig Start Date End Date Taking? Authorizing Provider  amiodarone (PACERONE) 200 MG tablet Take 1 tablet (200 mg total) by mouth daily. 10/23/13   Ripudeep Jenna Luo, MD  aspirin 81 MG chewable tablet Chew 81 mg by mouth daily.    Historical Provider, MD  donepezil (ARICEPT) 5 MG tablet Take 5 mg by mouth at bedtime.    Historical Provider, MD  furosemide (LASIX) 40 MG tablet Take 40 mg by mouth daily.    Historical Provider, MD  memantine (NAMENDA) 10 MG tablet Take 10 mg by mouth 2 (two) times daily.  Historical Provider, MD  metoprolol (LOPRESSOR) 100 MG tablet Take 100 mg by mouth 2 (two) times daily.    Historical Provider, MD  raloxifene (EVISTA) 60 MG tablet Take 60 mg by mouth daily.    Historical Provider, MD    Family History  Family History  Problem Relation Age of Onset  . Arthritis Mother   . Cancer Mother   . Thyroid disease Father   . Cancer Sister   . Hypertension Sister   . Heart disease Sister     Social History  Social History   Social History  . Marital Status: Widowed    Spouse Name: N/A  . Number of Children:  N/A  . Years of Education: N/A   Occupational History  . Not on file.   Social History Main Topics  . Smoking status: Never Smoker   . Smokeless tobacco: Never Used  . Alcohol Use: No  . Drug Use: No  . Sexual Activity: Not on file   Other Topics Concern  . Not on file   Social History Narrative     Review of Systems, as per HPI, otherwise negative General:  No chills, fever, night sweats or weight changes.  Cardiovascular:  No chest pain, dyspnea on exertion, edema, orthopnea, palpitations, paroxysmal nocturnal dyspnea. Dermatological: No rash, lesions/masses Respiratory: No cough, dyspnea Urologic: No hematuria, dysuria Abdominal:   No nausea, vomiting, diarrhea, bright red blood per rectum, melena, or hematemesis Neurologic:  No visual changes, wkns, changes in mental status. All other systems reviewed and are otherwise negative except as noted above.  Physical Exam  BP 108/54, HR 64 General: Pleasant, NAD Psych: Normal affect. Neuro: Alert and oriented X 1. Moves all extremities spontaneously. HEENT: Normal  Neck: Supple without bruits or JVD. Lungs:  Resp regular and unlabored, CTA. Heart: RRR no s3, s4, or murmurs. Abdomen: Soft, non-tender, non-distended, BS + x 4.  Extremities: No clubbing, cyanosis or edema. DP/PT/Radials 2+ and equal bilaterally.  Labs:  No results for input(s): CKTOTAL, CKMB, TROPONINI in the last 72 hours. Lab Results  Component Value Date   WBC 5.2 10/14/2013   HGB 12.9 10/14/2013   HCT 39.8 10/14/2013   MCV 96.4 10/14/2013   PLT 142* 10/14/2013    No results found for: DDIMER Invalid input(s): POCBNP    Component Value Date/Time   NA 140 12/17/2013 1101   K 3.5 12/17/2013 1101   CL 106 12/17/2013 1101   CO2 26 12/17/2013 1101   GLUCOSE 135* 12/17/2013 1101   BUN 20 12/17/2013 1101   CREATININE 1.1 12/17/2013 1101   CALCIUM 10.4 12/17/2013 1101   PROT 6.2 12/17/2013 1101   ALBUMIN 4.0 12/17/2013 1101   AST 20 12/17/2013  1101   ALT 11 12/17/2013 1101   ALKPHOS 80 12/17/2013 1101   BILITOT 0.7 12/17/2013 1101   GFRNONAA 50* 10/15/2013 0545   GFRAA 58* 10/15/2013 0545   No results found for: CHOL, HDL, LDLCALC, TRIG  Accessory Clinical Findings  Echocardiogram - 09/2013 Left ventricle: The cavity size was normal. There was mild focal basal hypertrophy of the septum. Systolic function was normal. The estimated ejection fraction was in the range of 60% to 65%. Wall motion was normal; there were no regional wall motion abnormalities. Doppler parameters are consistent with abnormal left ventricular relaxation (grade 1 diastolic dysfunction). - Aortic valve: Sclerosis without stenosis. There was mild regurgitation. - Mitral valve: Mildly calcified annulus. There was mild regurgitation. - Right ventricle: The cavity size was normal. Pacer  wire or catheter noted in right ventricle. Systolic function was normal. - Tricuspid valve: Peak RV-RA gradient (S): 23 mm Hg. - Systemic veins: IVC not visualized.  Impressions:  - Normal LV size with mild focal basal septal hypertrophy. EF 60-65%. Aortic sclerosis without stenosis. Mild AI. Mild MR. Normal RV size and systolic function.    Assessment & Plan  Principal Problem:  Syncope and collapse Active Problems:  Tachycardia, paroxysmal- HR as high as 200  per pacemaker check  Cardiac pacemaker in situ-implanted 2011  HTN (hypertension)  Dementia- recently placed in assited living  1. Atrial flutter with intermittent 1:1 conduction Doing well with amiodarone, the last interrogation in 01/2014, her pacemaker was functioning well. She had no episode of flutter since October 2015. Today she is in SR, we will continue the same dose of amiodarone. These just support my assumption that anticoagulation would cause higher risk than benefit in her particular case as she has very infrequent episodes of arrhythmia and has frequent falls. Her  CHAD-VASc score is high and is at least 4.  2. Hypotension -resolved after discontinuation of Toprol XL, advised to stay hydrated to avoid orthostatic hypotension.  Follow-up in 6 months.   Lars MassonNELSON, Suman Trivedi H, MD, [facility] 12/06/2014, 3:00 PM

## 2014-12-06 NOTE — Patient Instructions (Signed)
Your physician recommends that you continue on your current medications as directed. Please refer to the Current Medication list given to you today.     Your physician wants you to follow-up in: 6 MONTHS WITH DR NELSON You will receive a reminder letter in the mail two months in advance. If you don't receive a letter, please call our office to schedule the follow-up appointment.  

## 2015-01-11 ENCOUNTER — Encounter: Payer: Self-pay | Admitting: *Deleted

## 2015-02-03 ENCOUNTER — Telehealth: Payer: Self-pay | Admitting: Internal Medicine

## 2015-02-03 NOTE — Telephone Encounter (Signed)
New message  [facility] States that they received a letter indicating that the pt has not had a pacer check. Rep at [facility] reports that she did on 11/30/2014 durning Dr. Lindaann SloughNelson's OV??  Request a call back to discuss is the pacer appt is needed.

## 2015-02-03 NOTE — Telephone Encounter (Signed)
Spoke w/ pt healthcare POA. She stated that pt is on hospice, 82 years old, and has no quality of life. Pt POA wants to know if follow up for PPM can be disconnected b/c pt is in poor health. Instructed her that I would ask MD and call her back. Pt POA verbalized understanding.

## 2015-02-11 ENCOUNTER — Encounter: Payer: Self-pay | Admitting: *Deleted

## 2015-02-11 NOTE — Telephone Encounter (Signed)
Fax sent, confirmation received.

## 2015-02-11 NOTE — Telephone Encounter (Signed)
Returned Newt LukesCynthia Williams' call.  Advised that per Dr. Ladona Ridgelaylor, pacemaker follow-up can be discontinued, but that the pacemaker cannot be turned off.  Aram BeechamCynthia asked if pacemaker can be harvested and donated for reuse after patient's death, advised that this is not commonly done in this area as far as I am aware.  She requested that I contact [facility] to make them aware that patient can discontinue PPM follow-up.  Doran Duranddvised Cynthia that I would contact the charge nurse to make him/her aware.  Aram BeechamCynthia verbalized understanding of all information and was appreciative of call.  She denies additional questions at this time.

## 2015-02-11 NOTE — Telephone Encounter (Signed)
Spoke with Bronson IngYvette, [facility].  She requests that we fax an order to discontinue PPM follow-up.  Lockie Moladvised Yvette that I would fax order today.  She verbalized understanding and denied questions.  Fax # [phone]

## 2015-06-30 ENCOUNTER — Ambulatory Visit: Payer: Medicare Other | Admitting: Cardiology

## 2015-07-07 ENCOUNTER — Encounter: Payer: Self-pay | Admitting: Cardiology

## 2015-10-10 IMAGING — CR DG CHEST 1V PORT
1 series · 1 of 1 positions shown · non-contrast
Comparison: None.

CLINICAL DATA: Hypertension and altered mental status

EXAM:
PORTABLE CHEST - 1 VIEW

[portable]
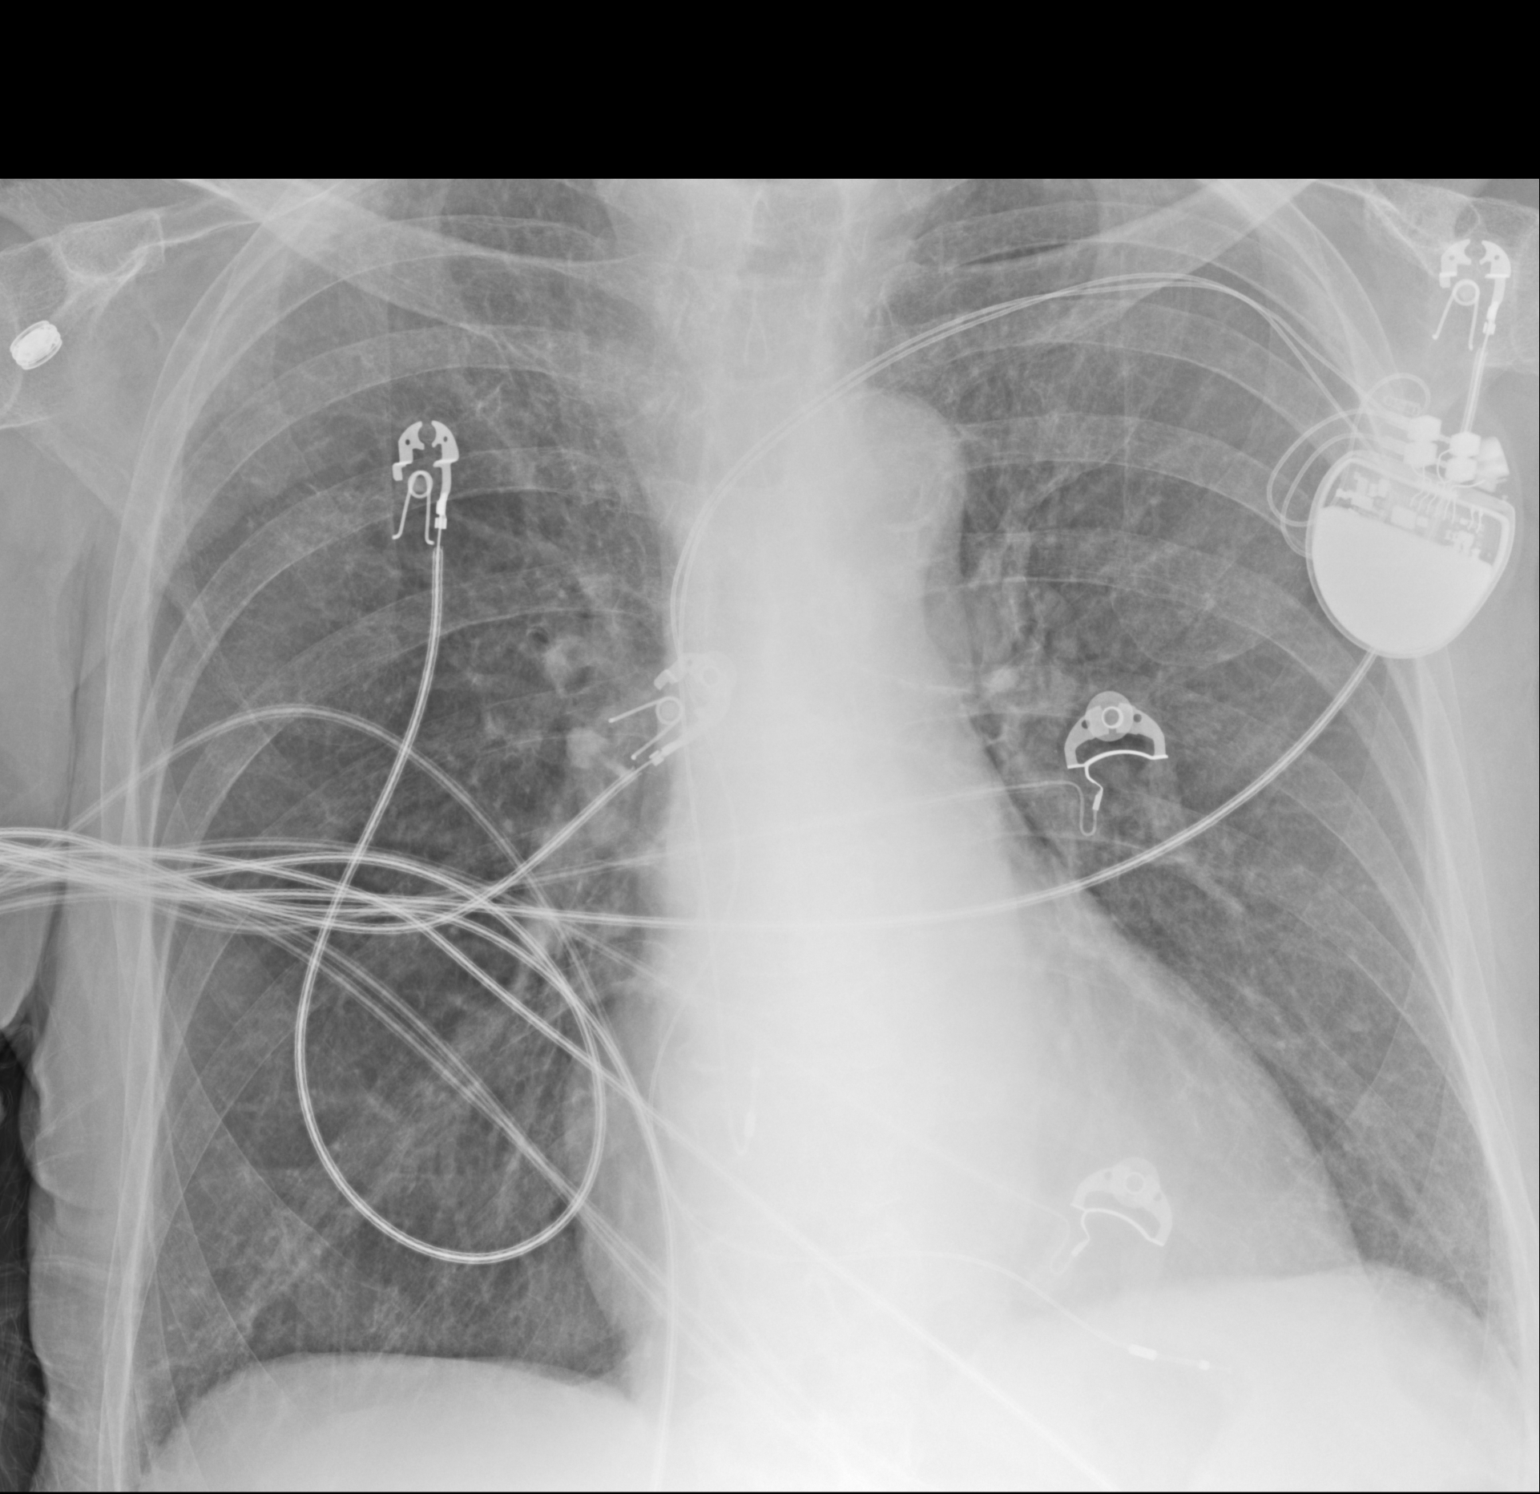

[1 of 1 positions shown; findings below may reference images not displayed]

FINDINGS: There is no edema or consolidation. Heart is slightly enlarged with
pulmonary vascularity within normal limits. Pacemaker leads are
attached to the right atrium and right ventricle. No pneumothorax.
No adenopathy.
IMPRESSION: No edema or consolidation. Heart mildly enlarged. Pacemaker present.

## 2015-10-10 IMAGING — CT CT HEAD W/O CM
2 series · 16 of 30 positions shown, 20 images · non-contrast
Comparison: None.

CLINICAL DATA: Loss of consciousness.

EXAM:
CT HEAD WITHOUT CONTRAST
TECHNIQUE: Contiguous axial images were obtained from the base of the skull
through the vertex without intravenous contrast.

[Series 201: head w/o, idose (1) · axial · non-contrast · 0.41mm/px · z∈[+61,+181]mm · 13 of 30 slices shown, 17 images]
[im 3/30  brain]
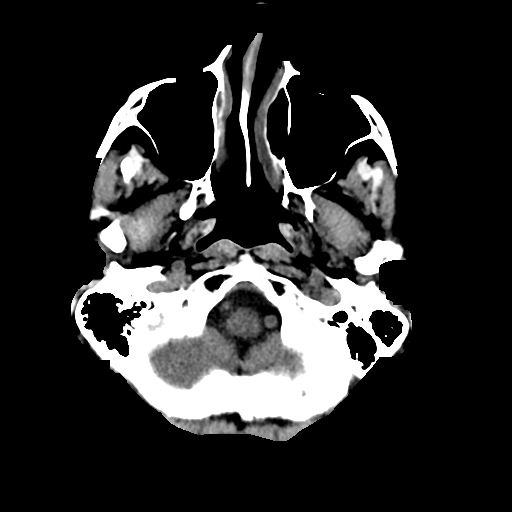
[im 3/30  bone]
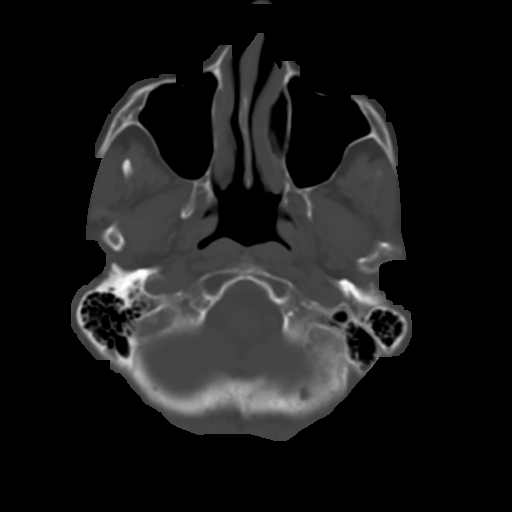
[im 5/30  brain]
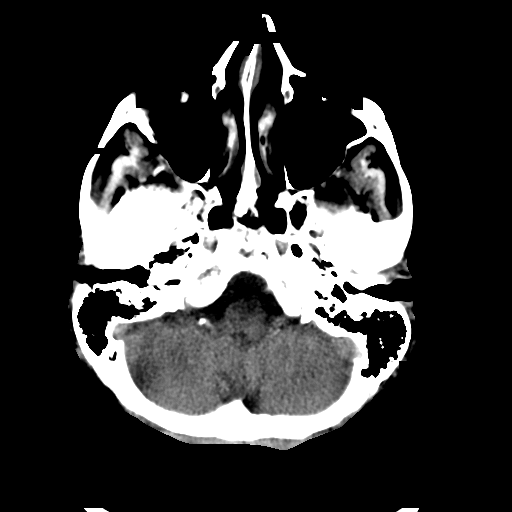
[im 7/30  brain]
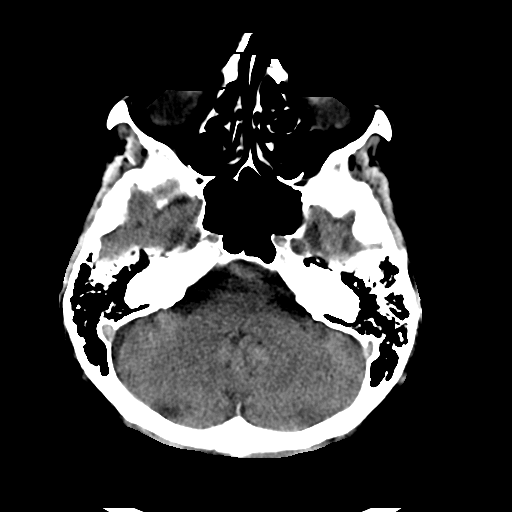
[im 9/30  brain]
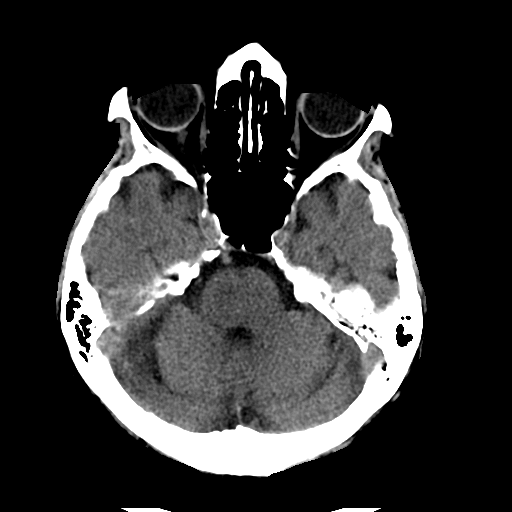
[im 11/30  brain]
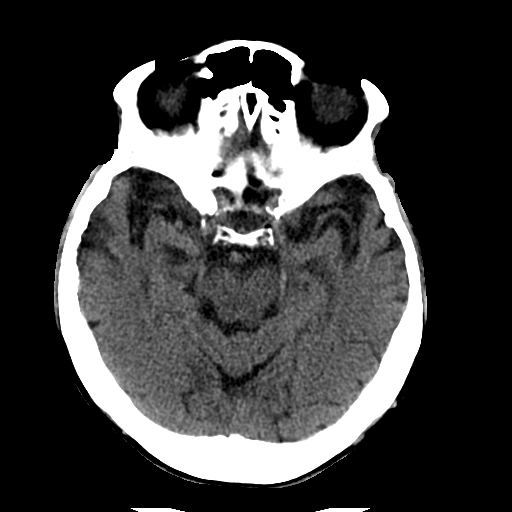
[im 11/30  bone]
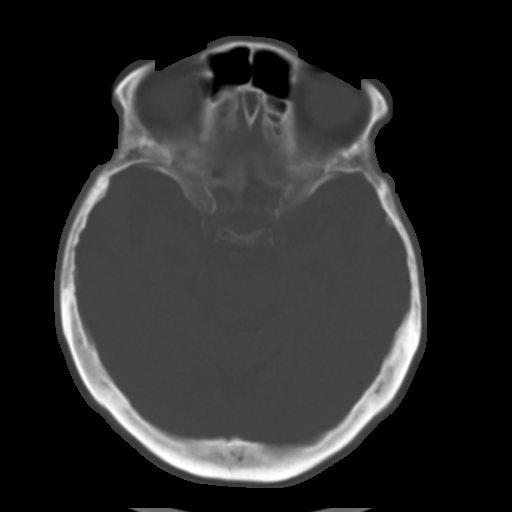
[im 13/30  brain]
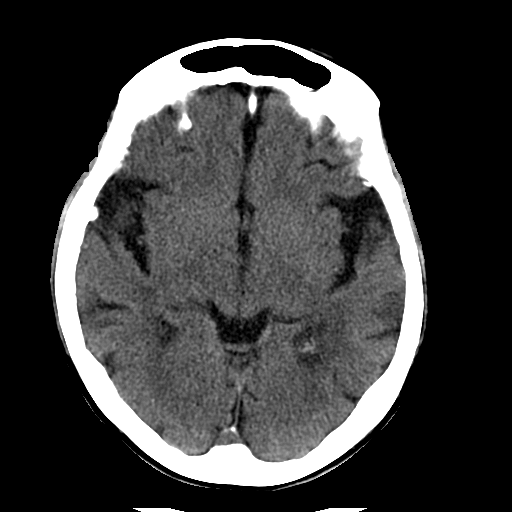
[im 15/30  brain]
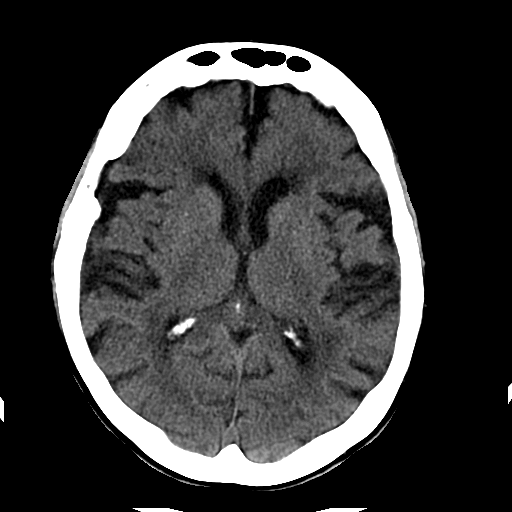
[im 17/30  brain]
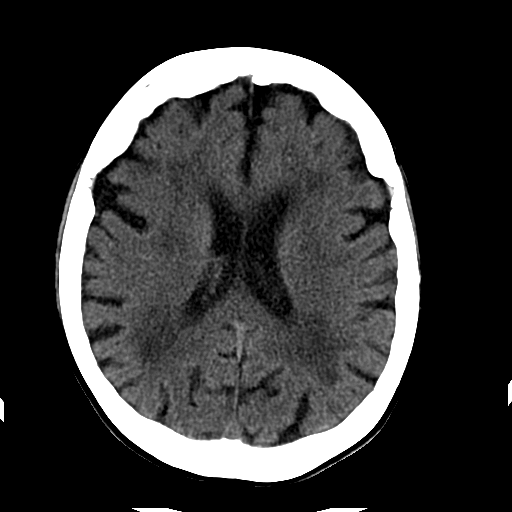
[im 19/30  brain]
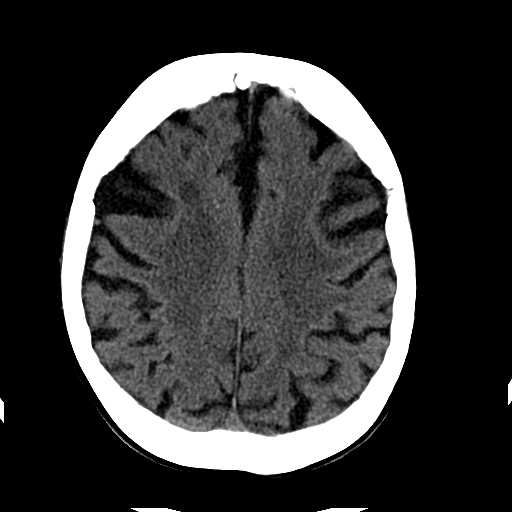
[im 19/30  bone]
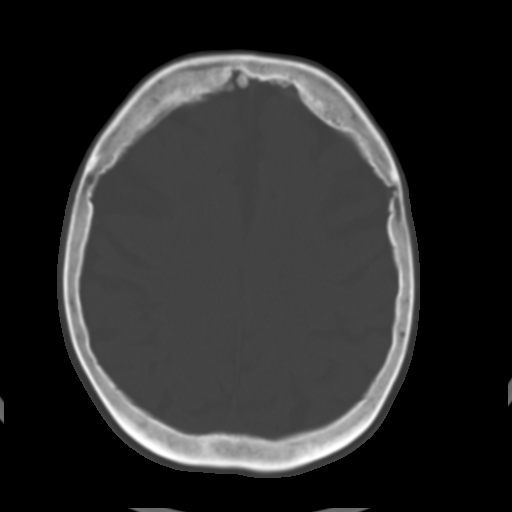
[im 21/30  brain]
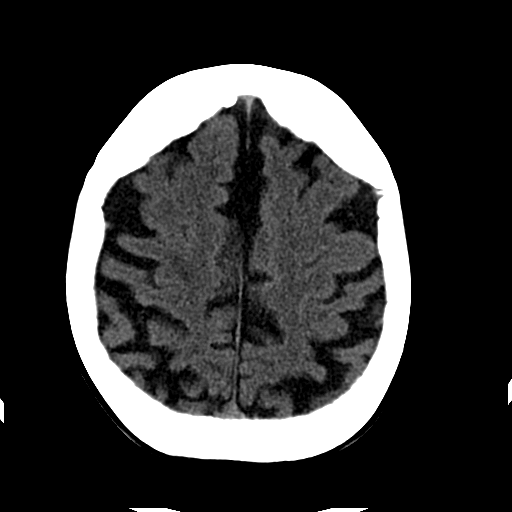
[im 23/30  brain]
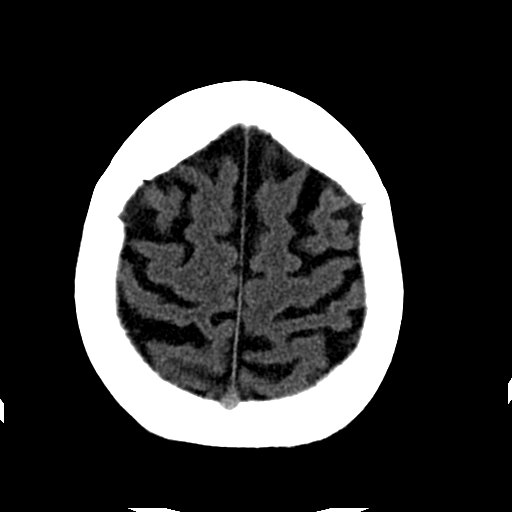
[im 25/30  brain]
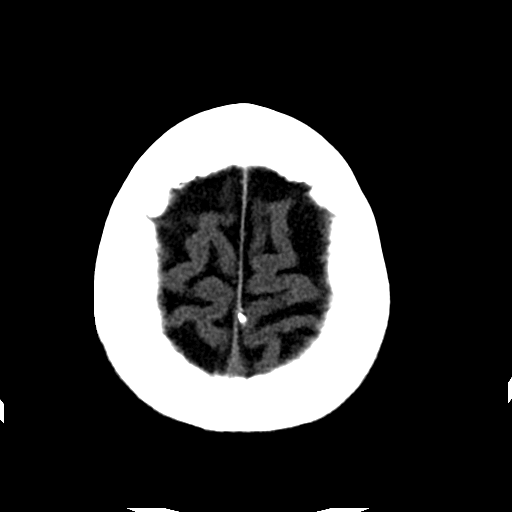
[im 27/30  brain]
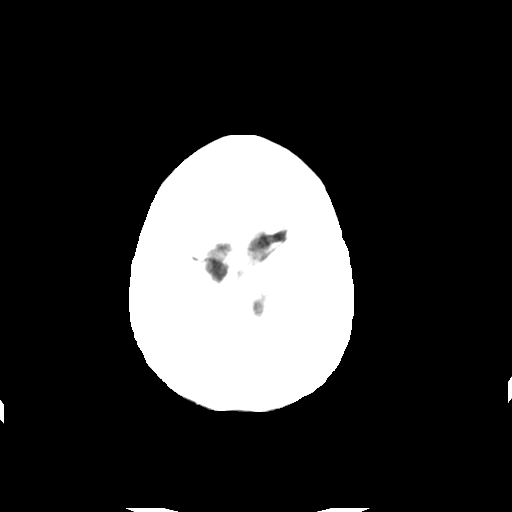
[im 27/30  bone]
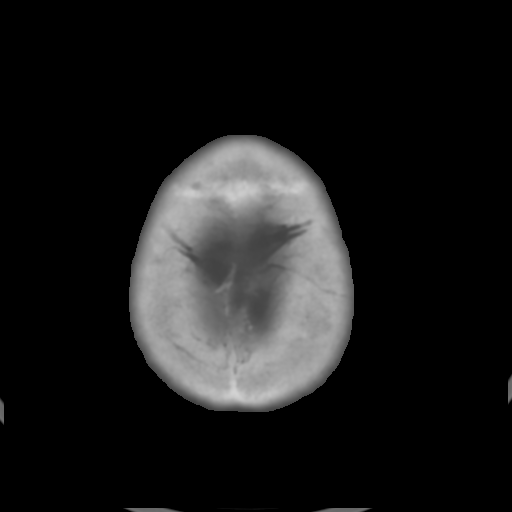

[Series 202: head w/o bone, idose (1) · axial · non-contrast · 0.41mm/px · z∈[+61,+101]mm · 3 of 30 slices shown]
[im 3/30  bone]
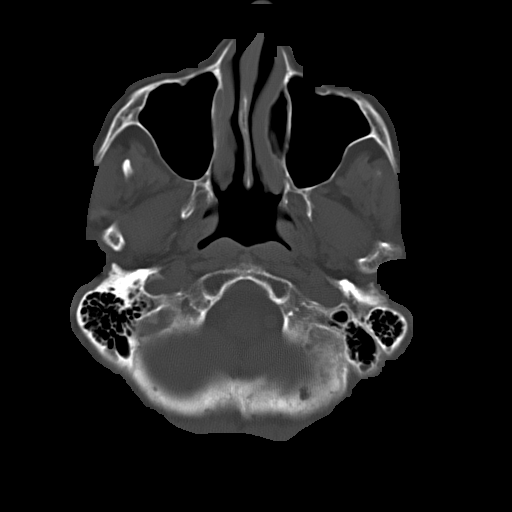
[im 7/30  bone]
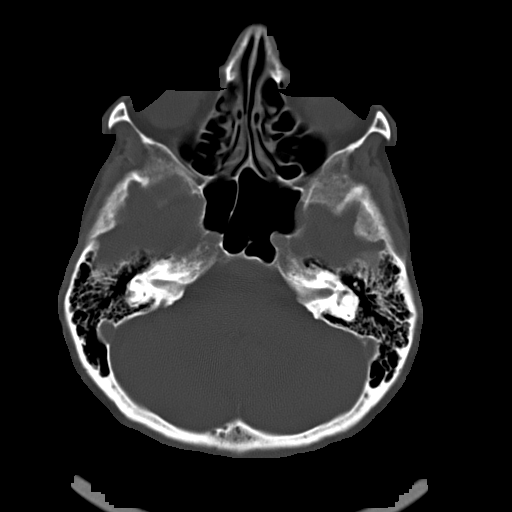
[im 11/30  bone]
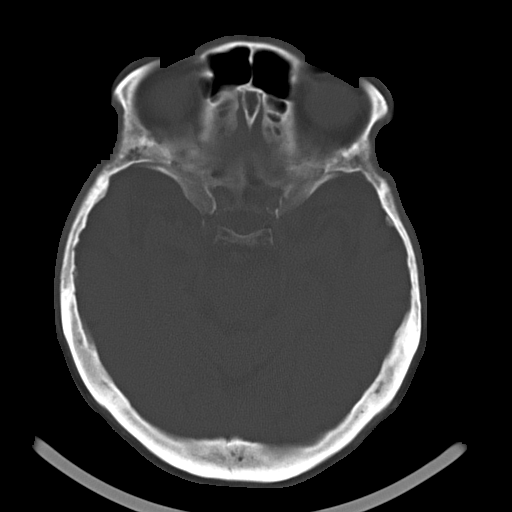

[16 of 30 positions shown; findings below may reference images not displayed]

FINDINGS: No acute intracranial hemorrhage. No focal mass lesion. No CT
evidence of acute infarction. No midline shift or mass effect. No
hydrocephalus. Basilar cisterns are patent.

Measure as cortical atrophy. There is mild periventricular white
matter hypodensities.
IMPRESSION: 1. No acute intracranial findings.
2. Cortical atrophy and periventricular white matter microvascular
disease

## 2016-07-29 DEATH — deceased
